# Patient Record
Sex: Male | Born: 1975 | Race: Black or African American | Hispanic: No
Health system: Southern US, Community
[De-identification: ages and names within clinical notes are randomized; demographics above are authoritative.]

---

## 2020-08-18 ENCOUNTER — Inpatient Hospital Stay (HOSPITAL_COMMUNITY): Payer: Self-pay

## 2020-08-18 ENCOUNTER — Emergency Department (HOSPITAL_COMMUNITY): Payer: Self-pay

## 2020-08-18 ENCOUNTER — Inpatient Hospital Stay (HOSPITAL_COMMUNITY)
Admission: EM | Admit: 2020-08-18 | Discharge: 2020-09-11 | DRG: 917 | Disposition: E | Payer: Self-pay | Attending: Internal Medicine | Admitting: Internal Medicine

## 2020-08-18 DIAGNOSIS — J9602 Acute respiratory failure with hypercapnia: Secondary | ICD-10-CM | POA: Diagnosis present

## 2020-08-18 DIAGNOSIS — G936 Cerebral edema: Secondary | ICD-10-CM | POA: Diagnosis present

## 2020-08-18 DIAGNOSIS — E1165 Type 2 diabetes mellitus with hyperglycemia: Secondary | ICD-10-CM | POA: Diagnosis present

## 2020-08-18 DIAGNOSIS — R579 Shock, unspecified: Secondary | ICD-10-CM

## 2020-08-18 DIAGNOSIS — E876 Hypokalemia: Secondary | ICD-10-CM | POA: Diagnosis present

## 2020-08-18 DIAGNOSIS — I469 Cardiac arrest, cause unspecified: Secondary | ICD-10-CM | POA: Diagnosis present

## 2020-08-18 DIAGNOSIS — F10129 Alcohol abuse with intoxication, unspecified: Secondary | ICD-10-CM | POA: Diagnosis present

## 2020-08-18 DIAGNOSIS — I472 Ventricular tachycardia, unspecified: Secondary | ICD-10-CM

## 2020-08-18 DIAGNOSIS — Z66 Do not resuscitate: Secondary | ICD-10-CM | POA: Diagnosis present

## 2020-08-18 DIAGNOSIS — R578 Other shock: Secondary | ICD-10-CM | POA: Diagnosis not present

## 2020-08-18 DIAGNOSIS — T50901A Poisoning by unspecified drugs, medicaments and biological substances, accidental (unintentional), initial encounter: Principal | ICD-10-CM | POA: Diagnosis present

## 2020-08-18 DIAGNOSIS — G9382 Brain death: Secondary | ICD-10-CM | POA: Diagnosis not present

## 2020-08-18 DIAGNOSIS — Z4659 Encounter for fitting and adjustment of other gastrointestinal appliance and device: Secondary | ICD-10-CM

## 2020-08-18 DIAGNOSIS — Z20822 Contact with and (suspected) exposure to covid-19: Secondary | ICD-10-CM | POA: Diagnosis present

## 2020-08-18 DIAGNOSIS — Y908 Blood alcohol level of 240 mg/100 ml or more: Secondary | ICD-10-CM | POA: Diagnosis present

## 2020-08-18 DIAGNOSIS — R4189 Other symptoms and signs involving cognitive functions and awareness: Secondary | ICD-10-CM

## 2020-08-18 DIAGNOSIS — G931 Anoxic brain damage, not elsewhere classified: Secondary | ICD-10-CM | POA: Diagnosis present

## 2020-08-18 DIAGNOSIS — G935 Compression of brain: Secondary | ICD-10-CM | POA: Diagnosis not present

## 2020-08-18 DIAGNOSIS — I468 Cardiac arrest due to other underlying condition: Secondary | ICD-10-CM | POA: Diagnosis present

## 2020-08-18 DIAGNOSIS — J9601 Acute respiratory failure with hypoxia: Secondary | ICD-10-CM | POA: Diagnosis present

## 2020-08-18 LAB — I-STAT ARTERIAL BLOOD GAS, ED
Acid-base deficit: 11 mmol/L — ABNORMAL HIGH (ref 0.0–2.0)
Bicarbonate: 18.4 mmol/L — ABNORMAL LOW (ref 20.0–28.0)
Calcium, Ion: 0.97 mmol/L — ABNORMAL LOW (ref 1.15–1.40)
HCT: 34 % — ABNORMAL LOW (ref 39.0–52.0)
Hemoglobin: 11.6 g/dL — ABNORMAL LOW (ref 13.0–17.0)
O2 Saturation: 100 %
Patient temperature: 98.6
Potassium: 2.2 mmol/L — CL (ref 3.5–5.1)
Sodium: 138 mmol/L (ref 135–145)
TCO2: 20 mmol/L — ABNORMAL LOW (ref 22–32)
pCO2 arterial: 55.6 mmHg — ABNORMAL HIGH (ref 32.0–48.0)
pH, Arterial: 7.128 — CL (ref 7.350–7.450)
pO2, Arterial: 314 mmHg — ABNORMAL HIGH (ref 83.0–108.0)

## 2020-08-18 LAB — I-STAT CHEM 8, ED
BUN: 4 mg/dL — ABNORMAL LOW (ref 8–23)
Calcium, Ion: 0.9 mmol/L — ABNORMAL LOW (ref 1.15–1.40)
Chloride: 98 mmol/L (ref 98–111)
Creatinine, Ser: 1.9 mg/dL — ABNORMAL HIGH (ref 0.61–1.24)
Glucose, Bld: 304 mg/dL — ABNORMAL HIGH (ref 70–99)
HCT: 34 % — ABNORMAL LOW (ref 39.0–52.0)
Hemoglobin: 11.6 g/dL — ABNORMAL LOW (ref 13.0–17.0)
Potassium: 2.3 mmol/L — CL (ref 3.5–5.1)
Sodium: 139 mmol/L (ref 135–145)
TCO2: 20 mmol/L — ABNORMAL LOW (ref 22–32)

## 2020-08-18 LAB — CBC WITH DIFFERENTIAL/PLATELET
Abs Immature Granulocytes: 0.29 10*3/uL — ABNORMAL HIGH (ref 0.00–0.07)
Basophils Absolute: 0 10*3/uL (ref 0.0–0.1)
Basophils Relative: 0 %
Eosinophils Absolute: 0 10*3/uL (ref 0.0–0.5)
Eosinophils Relative: 0 %
HCT: 35.5 % — ABNORMAL LOW (ref 39.0–52.0)
Hemoglobin: 11 g/dL — ABNORMAL LOW (ref 13.0–17.0)
Immature Granulocytes: 4 %
Lymphocytes Relative: 60 %
Lymphs Abs: 3.9 10*3/uL (ref 0.7–4.0)
MCH: 29.2 pg (ref 26.0–34.0)
MCHC: 31 g/dL (ref 30.0–36.0)
MCV: 94.2 fL (ref 80.0–100.0)
Monocytes Absolute: 0.3 10*3/uL (ref 0.1–1.0)
Monocytes Relative: 4 %
Neutro Abs: 2.1 10*3/uL (ref 1.7–7.7)
Neutrophils Relative %: 32 %
Platelets: 127 10*3/uL — ABNORMAL LOW (ref 150–400)
RBC: 3.77 MIL/uL — ABNORMAL LOW (ref 4.22–5.81)
RDW: 15.9 % — ABNORMAL HIGH (ref 11.5–15.5)
WBC: 6.7 10*3/uL (ref 4.0–10.5)
nRBC: 0.9 % — ABNORMAL HIGH (ref 0.0–0.2)

## 2020-08-18 LAB — TYPE AND SCREEN
ABO/RH(D): O POS
Antibody Screen: NEGATIVE

## 2020-08-18 LAB — URINALYSIS, ROUTINE W REFLEX MICROSCOPIC
Bilirubin Urine: NEGATIVE
Glucose, UA: NEGATIVE mg/dL
Ketones, ur: NEGATIVE mg/dL
Leukocytes,Ua: NEGATIVE
Nitrite: NEGATIVE
Protein, ur: NEGATIVE mg/dL
Specific Gravity, Urine: 1.003 — ABNORMAL LOW (ref 1.005–1.030)
pH: 6 (ref 5.0–8.0)

## 2020-08-18 LAB — RAPID URINE DRUG SCREEN, HOSP PERFORMED
Amphetamines: NOT DETECTED
Barbiturates: NOT DETECTED
Benzodiazepines: NOT DETECTED
Cocaine: NOT DETECTED
Opiates: NOT DETECTED
Tetrahydrocannabinol: NOT DETECTED

## 2020-08-18 LAB — GLUCOSE, CAPILLARY: Glucose-Capillary: 222 mg/dL — ABNORMAL HIGH (ref 70–99)

## 2020-08-18 LAB — MAGNESIUM: Magnesium: 3.2 mg/dL — ABNORMAL HIGH (ref 1.7–2.4)

## 2020-08-18 LAB — TROPONIN I (HIGH SENSITIVITY): Troponin I (High Sensitivity): 41 ng/L — ABNORMAL HIGH (ref <18)

## 2020-08-18 LAB — LACTIC ACID, PLASMA: Lactic Acid, Venous: >11 mmol/L (ref 0.5–1.9)

## 2020-08-18 LAB — PROTIME-INR
INR: 1.3 — ABNORMAL HIGH (ref 0.8–1.2)
Prothrombin Time: 15.3 seconds — ABNORMAL HIGH (ref 11.4–15.2)

## 2020-08-18 LAB — ACETAMINOPHEN LEVEL: Acetaminophen (Tylenol), Serum: <10 ug/mL — ABNORMAL LOW (ref 10–30)

## 2020-08-18 LAB — RESPIRATORY PANEL BY RT PCR (FLU A&B, COVID)
Influenza A by PCR: NEGATIVE
Influenza B by PCR: NEGATIVE
SARS Coronavirus 2 by RT PCR: NEGATIVE

## 2020-08-18 LAB — SALICYLATE LEVEL: Salicylate Lvl: <7 mg/dL — ABNORMAL LOW (ref 7.0–30.0)

## 2020-08-18 LAB — ETHANOL: Alcohol, Ethyl (B): 296 mg/dL — ABNORMAL HIGH (ref <10)

## 2020-08-18 MED ORDER — EPINEPHRINE 1 MG/10ML IJ SOSY
PREFILLED_SYRINGE | INTRAMUSCULAR | Status: AC | PRN
  Administered 2020-08-18 (×8): 1 mg via INTRAVENOUS

## 2020-08-18 MED ORDER — FENTANYL CITRATE (PF) 100 MCG/2ML IJ SOLN: 50.0000 ug | INTRAMUSCULAR | Status: DC | PRN

## 2020-08-18 MED ORDER — NOREPINEPHRINE BITARTRATE 1 MG/ML IV SOLN
INTRAVENOUS | Status: AC | PRN
  Administered 2020-08-18: 20 ug/kg/min via INTRAVENOUS

## 2020-08-18 MED ORDER — SODIUM CHLORIDE 0.9 % IV SOLN: INTRAVENOUS | Status: DC

## 2020-08-18 MED ORDER — IOHEXOL 350 MG/ML SOLN
100.0000 mL | Freq: Once | INTRAVENOUS | Status: AC | PRN
  Administered 2020-08-18: 100 mL via INTRAVENOUS

## 2020-08-18 MED ORDER — DOCUSATE SODIUM 50 MG/5ML PO LIQD
100.0000 mg | Freq: Two times a day (BID) | ORAL | Status: DC
  Administered 2020-08-20: 100 mg via ORAL
  Filled 2020-08-18 (×3): qty 10

## 2020-08-18 MED ORDER — SODIUM CHLORIDE 0.9 % IV SOLN: INTRAVENOUS | Status: DC | PRN

## 2020-08-18 MED ORDER — HEPARIN SODIUM (PORCINE) 5000 UNIT/ML IJ SOLN: 5000.0000 [IU] | Freq: Three times a day (TID) | INTRAMUSCULAR | Status: DC

## 2020-08-18 MED ORDER — MIDAZOLAM HCL 2 MG/2ML IJ SOLN: 2.0000 mg | INTRAMUSCULAR | Status: DC | PRN

## 2020-08-18 MED ORDER — INSULIN ASPART 100 UNIT/ML ~~LOC~~ SOLN
0.0000 [IU] | SUBCUTANEOUS | Status: DC
  Administered 2020-08-19 (×2): 8 [IU] via SUBCUTANEOUS
  Administered 2020-08-19: 5 [IU] via SUBCUTANEOUS
  Administered 2020-08-19: 2 [IU] via SUBCUTANEOUS
  Administered 2020-08-19: 3 [IU] via SUBCUTANEOUS
  Administered 2020-08-19: 2 [IU] via SUBCUTANEOUS
  Administered 2020-08-20 (×2): 3 [IU] via SUBCUTANEOUS
  Administered 2020-08-20: 5 [IU] via SUBCUTANEOUS
  Administered 2020-08-20: 8 [IU] via SUBCUTANEOUS
  Administered 2020-08-20 – 2020-08-21 (×2): 3 [IU] via SUBCUTANEOUS
  Administered 2020-08-21: 2 [IU] via SUBCUTANEOUS
  Administered 2020-08-21: 3 [IU] via SUBCUTANEOUS

## 2020-08-18 MED ORDER — POTASSIUM CHLORIDE 10 MEQ/100ML IV SOLN
10.0000 meq | INTRAVENOUS | Status: AC
  Administered 2020-08-18 – 2020-08-19 (×3): 10 meq via INTRAVENOUS
  Filled 2020-08-18 (×3): qty 100

## 2020-08-18 MED ORDER — POTASSIUM CITRATE-CITRIC ACID 1100-334 MG/5ML PO SOLN: 20.0000 meq | Freq: Once | ORAL | Status: DC

## 2020-08-18 MED ORDER — AMIODARONE HCL IN DEXTROSE 360-4.14 MG/200ML-% IV SOLN
60.0000 mg/h | INTRAVENOUS | Status: AC
  Administered 2020-08-18 – 2020-08-19 (×2): 60 mg/h via INTRAVENOUS

## 2020-08-18 MED ORDER — NOREPINEPHRINE 4 MG/250ML-% IV SOLN
0.0000 ug/min | INTRAVENOUS | Status: DC
  Administered 2020-08-19: 15 ug/min via INTRAVENOUS
  Administered 2020-08-19: 30 ug/min via INTRAVENOUS
  Filled 2020-08-18 (×2): qty 250

## 2020-08-18 MED ORDER — SODIUM CHLORIDE 0.9% FLUSH
10.0000 mL | Freq: Two times a day (BID) | INTRAVENOUS | Status: DC
  Administered 2020-08-18 – 2020-08-19 (×2): 10 mL
  Administered 2020-08-20: 20 mL
  Administered 2020-08-20 – 2020-08-21 (×2): 10 mL

## 2020-08-18 MED ORDER — CHLORHEXIDINE GLUCONATE 0.12% ORAL RINSE (MEDLINE KIT)
15.0000 mL | Freq: Two times a day (BID) | OROMUCOSAL | Status: DC
  Administered 2020-08-19 – 2020-08-21 (×6): 15 mL via OROMUCOSAL

## 2020-08-18 MED ORDER — VANCOMYCIN HCL 750 MG/150ML IV SOLN
750.0000 mg | Freq: Two times a day (BID) | INTRAVENOUS | Status: DC
  Administered 2020-08-19: 750 mg via INTRAVENOUS
  Filled 2020-08-18: qty 150

## 2020-08-18 MED ORDER — SODIUM BICARBONATE 8.4 % IV SOLN
INTRAVENOUS | Status: AC | PRN
  Administered 2020-08-18 (×2): 50 meq via INTRAVENOUS

## 2020-08-18 MED ORDER — HYDROCORTISONE NA SUCCINATE PF 100 MG IJ SOLR
50.0000 mg | Freq: Four times a day (QID) | INTRAMUSCULAR | Status: DC
  Administered 2020-08-19: 50 mg via INTRAVENOUS
  Filled 2020-08-18: qty 2

## 2020-08-18 MED ORDER — THIAMINE HCL 100 MG/ML IJ SOLN
100.0000 mg | Freq: Every day | INTRAMUSCULAR | Status: DC
  Administered 2020-08-19 – 2020-08-20 (×2): 100 mg via INTRAVENOUS
  Filled 2020-08-18 (×2): qty 2

## 2020-08-18 MED ORDER — AMIODARONE HCL IN DEXTROSE 360-4.14 MG/200ML-% IV SOLN
30.0000 mg/h | INTRAVENOUS | Status: DC
  Administered 2020-08-19 – 2020-08-20 (×2): 30 mg/h via INTRAVENOUS
  Filled 2020-08-18 (×5): qty 200

## 2020-08-18 MED ORDER — VANCOMYCIN HCL 2000 MG/400ML IV SOLN
2000.0000 mg | Freq: Once | INTRAVENOUS | Status: AC
  Administered 2020-08-19: 2000 mg via INTRAVENOUS
  Filled 2020-08-18: qty 400

## 2020-08-18 MED ORDER — POTASSIUM CHLORIDE 20 MEQ PO PACK
20.0000 meq | PACK | Freq: Once | ORAL | Status: AC
  Administered 2020-08-19: 20 meq
  Filled 2020-08-18: qty 1

## 2020-08-18 MED ORDER — DOCUSATE SODIUM 50 MG/5ML PO LIQD
100.0000 mg | Freq: Two times a day (BID) | ORAL | Status: DC | PRN
  Administered 2020-08-19 – 2020-08-20 (×2): 100 mg
  Filled 2020-08-18 (×2): qty 10

## 2020-08-18 MED ORDER — SODIUM CHLORIDE 0.9 % IV SOLN
INTRAVENOUS | Status: AC | PRN
  Administered 2020-08-18: 999 mL/h via INTRAVENOUS

## 2020-08-18 MED ORDER — POLYETHYLENE GLYCOL 3350 17 G PO PACK
17.0000 g | PACK | Freq: Every day | ORAL | Status: DC
  Administered 2020-08-19 – 2020-08-20 (×2): 17 g
  Filled 2020-08-18 (×2): qty 1

## 2020-08-18 MED ORDER — EPINEPHRINE HCL 5 MG/250ML IV SOLN IN NS
0.5000 ug/min | INTRAVENOUS | Status: DC
  Administered 2020-08-18: 10 ug/min via INTRAVENOUS
  Filled 2020-08-18: qty 250

## 2020-08-18 MED ORDER — SODIUM CHLORIDE 0.9 % IV SOLN
2.0000 g | Freq: Two times a day (BID) | INTRAVENOUS | Status: DC
  Administered 2020-08-19 (×2): 2 g via INTRAVENOUS
  Filled 2020-08-18 (×2): qty 2

## 2020-08-18 MED ORDER — DOPAMINE-DEXTROSE 3.2-5 MG/ML-% IV SOLN
INTRAVENOUS | Status: AC | PRN
  Administered 2020-08-18: 15 ug/kg/min via INTRAVENOUS

## 2020-08-18 MED ORDER — ORAL CARE MOUTH RINSE
15.0000 mL | OROMUCOSAL | Status: DC
  Administered 2020-08-19 – 2020-08-21 (×26): 15 mL via OROMUCOSAL

## 2020-08-18 MED ORDER — CHLORHEXIDINE GLUCONATE CLOTH 2 % EX PADS
6.0000 | MEDICATED_PAD | Freq: Every day | CUTANEOUS | Status: DC
  Administered 2020-08-19 – 2020-08-21 (×3): 6 via TOPICAL

## 2020-08-18 MED ORDER — VASOPRESSIN 20 UNITS/100 ML INFUSION FOR SHOCK: 0.0300 [IU]/min | INTRAVENOUS | Status: DC

## 2020-08-18 MED ORDER — MAGNESIUM SULFATE 2 GM/50ML IV SOLN
2.0000 g | Freq: Once | INTRAVENOUS | Status: AC
  Administered 2020-08-18: 2 g via INTRAVENOUS
  Filled 2020-08-18: qty 50

## 2020-08-18 MED ORDER — POLYETHYLENE GLYCOL 3350 17 G PO PACK
17.0000 g | PACK | Freq: Every day | ORAL | Status: DC | PRN
  Administered 2020-08-19: 17 g

## 2020-08-18 MED ORDER — AMIODARONE HCL 150 MG/3ML IV SOLN
INTRAVENOUS | Status: AC | PRN
  Administered 2020-08-18: 150 mg via INTRAVENOUS
  Administered 2020-08-18: 300 mg via INTRAVENOUS

## 2020-08-18 MED ORDER — SODIUM CHLORIDE 0.9% FLUSH: 10.0000 mL | INTRAVENOUS | Status: DC | PRN

## 2020-08-18 MED ORDER — FAMOTIDINE 40 MG/5ML PO SUSR
20.0000 mg | Freq: Two times a day (BID) | ORAL | Status: DC
  Administered 2020-08-19 – 2020-08-20 (×4): 20 mg
  Filled 2020-08-18 (×4): qty 2.5

## 2020-08-18 MED ORDER — PANTOPRAZOLE SODIUM 40 MG IV SOLR
40.0000 mg | Freq: Two times a day (BID) | INTRAVENOUS | Status: DC
  Administered 2020-08-19 – 2020-08-21 (×6): 40 mg via INTRAVENOUS
  Filled 2020-08-18 (×6): qty 40

## 2020-08-18 NOTE — Code Documentation (Signed)
Chest compressions continues. 

## 2020-08-18 NOTE — Code Documentation (Signed)
No palpable pulses , chest compressions restarted .

## 2020-08-18 NOTE — Code Documentation (Signed)
Pulse check - no pulse /V-tach , 3rd shocked delivered.

## 2020-08-18 NOTE — H&P (Addendum)
NAME:  Henry Lynn, MRN:  [id number], DOB:  11/11/1877, LOS: 0 ADMISSION DATE:  09/03/2020, CONSULTATION DATE:  09/08/2020 REFERRING MD:  Stevie Kern, CHIEF COMPLAINT:  Witnessed out of hospital cardiac arrest  Brief History   89yM unknown PMH who is s/p asystole to VT arrest with prolonged down time c/b early evidence significant anoxic brain injury  History of present illness   89yM unknown PMH who had witnessed out of hospital cardiac arrest. Witnesses called EMS, no CPR performed prior to EMS arrival with unknown duration of downtime prior to EMS arrival. Presenting rhythm asystole, 30 min ACLS in field, 20-30 min ACLS performed here. On presentation to ED here, initially in PEA arrest, later pulseless VT defibrillated x3, given amio boluses then infusion. 1L saline given during arrest, currently infusing second liter, couple amps of bicarb. He is on epi and levo drips at time of my evaluation. He remains unresponsive with fixed pupils.   Past Medical History  Unknown  Significant Hospital Events   S/p Asystole -> VT arrest with ROSC   Consults:  PCCM  Procedures:  10/8 R femoral arterial line  Significant Diagnostic Tests:  10/8 CTA chest no PE, small bilateral effusions with atelectasis 10/8 CTH with loss of gray white discrimination diffusely  Micro Data:  None available  Antimicrobials:  10/8 vanc/cefepime -   Interim history/subjective:  n/a  Objective   Blood pressure (!) 82/51, pulse 85, resp. rate (!) 24, height 5\' 10"  (1.778 m), SpO2 100 %.    Vent Mode: PRVC FiO2 (%):  [100 %] 100 % Set Rate:  [24 bmp] 24 bmp Vt Set:  [580 mL] 580 mL PEEP:  [5 cmH20] 5 cmH20 Plateau Pressure:  [20 cmH20] 20 cmH20  No intake or output data in the 24 hours ending 08/24/2020 2140 There were no vitals filed for this visit.  Examination: General: intubated, unresponsive HENT: NCAT, dry MM Lungs: mechanical breath sounds, normal work of breathing Cardiovascular: RRR, no  murmur, no JVD  Abdomen: soft, nontender, normal bowel sounds Extremities: warm, well-perfused without cyanosis, edema Neuro: does not follow commands, does not withdraw to pain, pupils fixed and dilated, no cough, no gag  Resolved Hospital Problem list   n/a  Assessment & Plan:   # Cardiac arrest: s/p PEA to VT arrest. Unclear etiology. Electrolytes are remarkably abnormal - wonder if this could have been contributory if he was susceptible to scd in setting of LVH. Some electrolyte abnormalities may be related to alcohol use given detectable etoh at admission, TCP. Although it is early in course, his pupillary exam and CTH findings would seem to portend a very poor prognosis regardless.  - ok to complete amiodarone 24h infusion given pulseless VT complicating arrest - fever avoidance - monitor for onset of clinical seizure activity, if not following commands in AM consider vEEG - formal TTE in the morning - shoot for SpO2 92-96% - avoid hypo/hypercapnia, keep 30 < pCO2 <45 - avoid sedation if possible, watch for neurologic recovery  # Shock: Doesn't appear overtly hypovolemic - IVC appears full with little respiratory variation on bedside 2141, EF appears mildly-moderately reduced 40% or so immediately post arrest.  - blood cx, UA - empiric ABX, narrow quickly as cultures allow or discontinue after 48h NG if no clear source - wean epi first, continue levo for MAP >65, vaso, stress dose steroids - formal tte in the morning - trend trop  # Bloody output in OGT:  - hold ppx ac for  now - protonix bid   # Lactic acidosis: - trend lactic  # Acute encephalopathy due to anoxic brain injury:  - as above avoid sedation if possible, watch for neurologic recovery  # Hypokalemia: - give potassium, recheck electrolytes  # Hypocalcemia: - check phos in setting of AKI vs CKD before replacement   Best practice:  Diet: npo Pain/Anxiety/Delirium protocol (if indicated): yes VAP protocol (if  indicated): yes DVT prophylaxis: yes GI prophylaxis: yes Glucose control: SSI Mobility:bed level Code Status: Full Family Communication: Unknown family Disposition: ICU  Labs   CBC: Recent Labs  Lab 08/25/2020 2036 09/04/2020 2102 08/14/2020 2103  WBC 6.7  --   --   NEUTROABS 2.1  --   --   HGB 11.0* 11.6* 11.6*  HCT 35.5* 34.0* 34.0*  MCV 94.2  --   --   PLT 127*  --   --     Basic Metabolic Panel: Recent Labs  Lab 08/11/2020 2102 09/02/2020 2103  NA 139 138  K 2.3* 2.2*  CL 98  --   GLUCOSE 304*  --   BUN 4*  --   CREATININE 1.90*  --    GFR: CrCl cannot be calculated (Unknown ideal weight.). Recent Labs  Lab 08/17/2020 2036  WBC 6.7    Liver Function Tests: No results for input(s): AST, ALT, ALKPHOS, BILITOT, PROT, ALBUMIN in the last 168 hours. No results for input(s): LIPASE, AMYLASE in the last 168 hours. No results for input(s): AMMONIA in the last 168 hours.  ABG    Component Value Date/Time   PHART 7.128 (LL) 08/27/2020 2103   PCO2ART 55.6 (H) 09/10/2020 2103   PO2ART 314 (H) 08/28/2020 2103   HCO3 18.4 (L) 09/08/2020 2103   TCO2 20 (L) 08/13/2020 2103   ACIDBASEDEF 11.0 (H) 09/06/2020 2103   O2SAT 100.0 08/11/2020 2103     Coagulation Profile: Recent Labs  Lab 09/09/2020 2036  INR 1.3*    Cardiac Enzymes: No results for input(s): CKTOTAL, CKMB, CKMBINDEX, TROPONINI in the last 168 hours.  HbA1C: No results found for: HGBA1C  CBG: No results for input(s): GLUCAP in the last 168 hours.  Review of Systems:   unable to obtain, intubated and not responsive at time of my evaluation  Past Medical History  He,  has no past medical history on file.   Surgical History   Unavailable   Social History     Unavailable Family History   His family history is not on file.   Allergies Not on File   Home Medications  Prior to Admission medications   Not on File     Critical care time: 30 minutes    This patient is critically ill with  multiple organ system failure; which, requires frequent high complexity decision making, assessment, support, evaluation, and titration of therapies. This was completed through the application of advanced monitoring technologies and extensive interpretation of multiple databases. During this encounter critical care time was devoted to patient care services described in this note for 30 minutes.  Vida Roller, Pulmonary/Critical Care

## 2020-08-18 NOTE — Code Documentation (Signed)
No pulse , chest compressions continues. 

## 2020-08-18 NOTE — ED Notes (Signed)
Patient arrived with EMS CPR in progress Samuel Bouche thumper) , [place] , CPR initiated by EMS received Epinephrine 1 mg 5 doses for Asystole prior to arrival , intraosseous at right tibia /Left AC 18g.Marland Kitchen

## 2020-08-18 NOTE — Progress Notes (Signed)
Transferred pt on the vent with RN x 2 to [facility] 09 without any complications.

## 2020-08-18 NOTE — Code Documentation (Signed)
Faint pulses present  

## 2020-08-18 NOTE — Code Documentation (Addendum)
Pulse check-  V- Tach - Shocked x1 , chest compressions continues.

## 2020-08-18 NOTE — Code Documentation (Signed)
PEA , chest compressions continues.

## 2020-08-18 NOTE — Progress Notes (Signed)
Pharmacy Antibiotic Note  Henry Lynn is a 44 y.o. male admitted on 08/27/2020 while CPR was in progress.  Patient had ROSC after 45-60 min of total CPR.  Pharmacy has been consulted for vancomycin and cefepime dosing as empiric therapy for sepsis.  Unknown age, weight and allergies.   Use estimated weight of 51kg and 44 year old for antibiotic dosing. SCr 1.9, nCrCL 50 ml/min, WBC WNL, LA > 11.  Plan: Vanc 2gm IV x 1, then 750mg  IV Q12H Cefepime 2g IV Q12H Monitor renal fxn and may need to dose vanc per level F/u updated weight, age  Height: 5\' 10"  (177.8 cm) IBW/kg (Calculated) : 73  No data recorded.  Recent Labs  Lab 08/26/2020 2036 08/17/2020 2102  WBC 6.7  --   CREATININE 1.74* 1.90*  LATICACIDVEN >11.0*  --     CrCl cannot be calculated (Unknown ideal weight.).    Not on File  Vanc 10/8 >> Cefepime 10/8 >>  10/8 covid / flu - negative 10/8 TA - 10/8 BCx -   Dezaria Methot D. 12/8, PharmD, BCPS, BCCCP 09/04/2020, 10:34 PM

## 2020-08-18 NOTE — Code Documentation (Signed)
Pulse check PEA , chest compressions continues.

## 2020-08-18 NOTE — Progress Notes (Signed)
Patient was taken to CT with RN x 2  and back to [facility] room without any complications.

## 2020-08-18 NOTE — Code Documentation (Signed)
Pulse check- present . Central line access set up for EDP .

## 2020-08-18 NOTE — ED Provider Notes (Addendum)
[facility] Provider Note   CSN: [id number] Arrival date & time: 08/19/2020  2005     History Chief Complaint  Patient presents with  . CPR in progress    [facility] is a 44 y.o. male.  Presents to ER as CPR in progress.  History limited due to acuity, unresponsive state.  Per EMS report Patient arrived with EMS CPR in progress [place] thumper) , witnessed collapsed [place] , CPR initiated by EMS received Epinephrine 1 mg 5 doses for Asystole prior to arrival , intraosseous at right tibia /Left AC 18g.. Intubated in field.  HPI     No past medical history on file.  Patient Active Problem List   Diagnosis Date Noted  . Cardiac arrest ([place]) 09/07/2020    No family history on file.  Social History   Tobacco Use  . Smoking status: Not on file  Substance Use Topics  . Alcohol use: Not on file  . Drug use: Not on file    Home Medications Prior to Admission medications   Not on File    Allergies    Patient has no allergy information on record.  Review of Systems   Review of Systems  Unable to perform ROS: Patient unresponsive    Physical Exam Updated Vital Signs BP 92/60   Pulse 84   Resp (!) 22   Ht 5' 10" (1.778 m)   SpO2 100%   Physical Exam Vitals and nursing note reviewed.  Constitutional:      Appearance: He is well-developed.     Comments: Unresponsive, CPR in progress  HENT:     Head: Normocephalic and atraumatic.     Nose: Nose normal.     Mouth/Throat:     Comments: ET tube in place Eyes:     Comments: Pupils fixed and dilated  Cardiovascular:     Comments: CPR in progress, no pulses on pulse check Pulmonary:     Comments: Bagged via endotracheal tube, equal breath sounds bilaterally with bagging Abdominal:     General: There is no distension.     Palpations: Abdomen is soft.  Genitourinary:    Penis: Normal.   Musculoskeletal:        General: No deformity or signs of injury.      Cervical back: Neck supple.  Skin:    General: Skin is dry.     Coloration: Skin is not pale.     Findings: No bruising or lesion.  Neurological:     Comments: GCS 3 T Pupils fixed and dilated No response to any stimuli     ED Results / Procedures / Treatments   Labs (all labs ordered are listed, but only abnormal results are displayed) Labs Reviewed  CBC WITH DIFFERENTIAL/PLATELET - Abnormal; Notable for the following components:      Result Value   RBC 3.77 (*)    Hemoglobin 11.0 (*)    HCT 35.5 (*)    RDW 15.9 (*)    Platelets 127 (*)    nRBC 0.9 (*)    Abs Immature Granulocytes 0.29 (*)    All other components within normal limits  COMPREHENSIVE METABOLIC PANEL - Abnormal; Notable for the following components:   Potassium 2.4 (*)    CO2 17 (*)    Glucose, Bld 314 (*)    BUN 6 (*)    Creatinine, Ser 1.74 (*)    Calcium 7.6 (*)    Total Protein 5.5 (*)  Albumin 2.8 (*)    AST 362 (*)    ALT 173 (*)    GFR, Estimated 23 (*)    Anion gap 25 (*)    All other components within normal limits  LACTIC ACID, PLASMA - Abnormal; Notable for the following components:   Lactic Acid, Venous >11.0 (*)    All other components within normal limits  MAGNESIUM - Abnormal; Notable for the following components:   Magnesium 3.2 (*)    All other components within normal limits  PROTIME-INR - Abnormal; Notable for the following components:   Prothrombin Time 15.3 (*)    INR 1.3 (*)    All other components within normal limits  URINALYSIS, ROUTINE W REFLEX MICROSCOPIC - Abnormal; Notable for the following components:   Color, Urine STRAW (*)    Specific Gravity, Urine 1.003 (*)    Hgb urine dipstick SMALL (*)    Bacteria, UA RARE (*)    All other components within normal limits  ETHANOL - Abnormal; Notable for the following components:   Alcohol, Ethyl (B) 296 (*)    All other components within normal limits  ACETAMINOPHEN LEVEL - Abnormal; Notable for the following components:    Acetaminophen (Tylenol), Serum <10 (*)    All other components within normal limits  SALICYLATE LEVEL - Abnormal; Notable for the following components:   Salicylate Lvl <3.2 (*)    All other components within normal limits  GLUCOSE, CAPILLARY - Abnormal; Notable for the following components:   Glucose-Capillary 222 (*)    All other components within normal limits  I-STAT CHEM 8, ED - Abnormal; Notable for the following components:   Potassium 2.3 (*)    BUN 4 (*)    Creatinine, Ser 1.90 (*)    Glucose, Bld 304 (*)    Calcium, Ion 0.90 (*)    TCO2 20 (*)    Hemoglobin 11.6 (*)    HCT 34.0 (*)    All other components within normal limits  I-STAT ARTERIAL BLOOD GAS, ED - Abnormal; Notable for the following components:   pH, Arterial 7.128 (*)    pCO2 arterial 55.6 (*)    pO2, Arterial 314 (*)    Bicarbonate 18.4 (*)    TCO2 20 (*)    Acid-base deficit 11.0 (*)    Potassium 2.2 (*)    Calcium, Ion 0.97 (*)    HCT 34.0 (*)    Hemoglobin 11.6 (*)    All other components within normal limits  TROPONIN I (HIGH SENSITIVITY) - Abnormal; Notable for the following components:   Troponin I (High Sensitivity) 41 (*)    All other components within normal limits  RESPIRATORY PANEL BY RT PCR (FLU A&B, COVID)  CULTURE, BLOOD (ROUTINE X 2)  CULTURE, BLOOD (ROUTINE X 2)  CULTURE, RESPIRATORY  MRSA PCR SCREENING  RAPID URINE DRUG SCREEN, HOSP PERFORMED  LACTIC ACID, PLASMA  CBC  CREATININE, SERUM  COMPREHENSIVE METABOLIC PANEL  MAGNESIUM  BRAIN NATRIURETIC PEPTIDE  CBC WITH DIFFERENTIAL/PLATELET  BLOOD GAS, ARTERIAL  PHOSPHORUS  HEMOGLOBIN A1C  AMMONIA  HIV ANTIBODY (ROUTINE TESTING W REFLEX)  CK  OSMOLALITY  BLOOD GAS, ARTERIAL  TYPE AND SCREEN  ABO/RH  TROPONIN I (HIGH SENSITIVITY)    EKG None 08/19/20 2042 Sinus rhythm Probable RVH w/ secondary repol abnormality Repolarization abnormality, prob rate related Prolonged QT interval  Radiology CT Head Wo  Contrast  Result Date: 09/10/2020 CLINICAL DATA:  Altered mental status. EXAM: CT HEAD WITHOUT CONTRAST TECHNIQUE: Contiguous axial images  were obtained from the base of the skull through the vertex without intravenous contrast. COMPARISON:  None. FINDINGS: Brain: There is diffuse loss of gray-white matter discrimination consistent with hypoxic-ischemic encephalopathy. Clinical correlation is recommended. There is no acute intracranial hemorrhage. No midline shift. No extra-axial fluid collection. Vascular: No hyperdense vessel or unexpected calcification. Skull: Normal. Negative for fracture or focal lesion. Sinuses/Orbits: There is diffuse mucoperiosteal thickening of paranasal sinuses and ethmoid air cells. No air-fluid level. The mastoid air cells are clear. There is opacification of the nasopharynx. Partially visualized enteric tube. Other: None IMPRESSION: Findings consistent with hypoxic-ischemic encephalopathy. Clinical correlation is recommended. No acute intracranial hemorrhage. These results were called by telephone at the time of interpretation on 09/08/2020 at 9:41 pm to provider [facility] , who verbally acknowledged these results. Electronically Signed   By: Anner Crete M.D.   On: 08/27/2020 21:50   CT Angio Chest PE W and/or Wo Contrast  Result Date: 08/23/2020 CLINICAL DATA:  PE suspected, high probability. EXAM: CT ANGIOGRAPHY CHEST WITH CONTRAST TECHNIQUE: Multidetector CT imaging of the chest was performed using the standard protocol during bolus administration of intravenous contrast. Multiplanar CT image reconstructions and MIPs were obtained to evaluate the vascular anatomy. CONTRAST:  128m OMNIPAQUE IOHEXOL 350 MG/ML SOLN COMPARISON:  None. FINDINGS: Cardiovascular: The heart size is normal. Aorta and great vessel origins are within normal limits. Pulmonary artery opacification is satisfactory. No focal filling defect scratched at no focal filling defects are present to  suggest pulmonary embolus. Mediastinum/Nodes: The patient is intubated. Enteric tube is in place. Significant mediastinal or hilar adenopathy is present. Lungs/Pleura: Small effusions are present bilaterally. Associated atelectasis is present. No other significant airspace disease or consolidation present. No pneumothorax is present. Upper Abdomen: Visualized upper abdomen is unremarkable. Musculoskeletal: Vertebral body heights and alignment are normal. No focal lytic or blastic lesions are present. Ribs are unremarkable. Review of the MIP images confirms the above findings. IMPRESSION: 1. No pulmonary embolus. 2. Small bilateral pleural effusions with associated atelectasis. Electronically Signed   By: CSan MorelleM.D.   On: 09/08/2020 21:37   DG Chest Portable 1 View  Result Date: 09/04/2020 CLINICAL DATA:  Check endotracheal tube placement EXAM: PORTABLE CHEST 1 VIEW COMPARISON:  None. FINDINGS: Endotracheal tube is noted in satisfactory position. Gastric catheter is noted within the stomach. Heart is mildly prominent but accentuated by the frontal technique. Poor inspiratory effort is noted with crowding of the vascular markings. There may be some mild vascular congestion. IMPRESSION: Endotracheal tube and gastric catheter in satisfactory position. Poor inspiratory effort with crowding of the vascular markings. Mild vascular congestion may be present. Electronically Signed   By: MInez CatalinaM.D.   On: 08/30/2020 21:42    Procedures CPR  Date/Time: 08/22/2020 11:39 PM Performed by: DLucrezia Starch MD Authorized by: DLucrezia Starch MD  CPR Procedure Details:    ACLS/BLS initiated by EMS: Yes     CPR/ACLS performed in the ED: Yes     Duration of CPR (minutes):  25   Outcome: ROSC obtained    CPR performed via ACLS guidelines under my direct supervision.  See RN documentation for details including defibrillator use, medications, doses and timing. Comments:     CPR in progress by  EMS -they estimated 30 minutes of compressions prior to arrival.  On arrival, PEA, followed ACLS protocol epinephrine, bicarb, later pulse check showed V. tach, defibrillated at 200J for total of 3 defibrillations, also gave amiodarone bolus 300 mg,  then later bolus of 150 mg.  ROSC obtained.  Pulses lost briefly 2 subsequent occasions, on both of these occasions, pulses obtained on first pulse check.  Estimate total ER CPR time 20-25 minutes. .Critical Care Performed by: Lucrezia Starch, MD Authorized by: Lucrezia Starch, MD   Critical care provider statement:    Critical care time (minutes):  95   Critical care was time spent personally by me on the following activities:  Discussions with consultants, evaluation of patient's response to treatment, examination of patient, ordering and performing treatments and interventions, ordering and review of laboratory studies, ordering and review of radiographic studies, pulse oximetry, re-evaluation of patient's condition, obtaining history from patient or surrogate and review of old charts .Central Line  Date/Time: 09/09/2020 11:44 PM Performed by: Lucrezia Starch, MD Authorized by: Lucrezia Starch, MD   Pre-procedure details:    Skin preparation:  ChloraPrep Anesthesia (see MAR for exact dosages):    Anesthesia method:  None Procedure details:    Location:  R femoral   Site selection rationale:  Placed during code, ease of access   Catheter size:  7 Fr   Landmarks identified: yes     Ultrasound guidance: yes     Sterile ultrasound techniques: Sterile gel and sterile probe covers were used     Number of attempts:  1   Successful placement: yes   Post-procedure details:    Post-procedure:  Dressing applied   Assessment:  Blood return through all ports and free fluid flow   Patient tolerance of procedure:  Tolerated well, no immediate complications Comments:     Central line placed immediately after ROSC obtained, site selected for  ease of access, need for emergent access, prepped with chloraprep, used sterlie technique, all reasonable attempts at sterility were performed.   (including critical care time)   (including critical care time)     Medications Ordered in ED Medications  amiodarone (NEXTERONE PREMIX) 360-4.14 MG/200ML-% (1.8 mg/mL) IV infusion (60 mg/hr Intravenous New Bag/Given 08/28/2020 2040)  amiodarone (NEXTERONE PREMIX) 360-4.14 MG/200ML-% (1.8 mg/mL) IV infusion (has no administration in time range)  norepinephrine (LEVOPHED) 12m in 2528mpremix infusion (25 mcg/min Intravenous Rate/Dose Change 08/16/2020 2229)  EPINEPHrine (ADRENALIN) 4 mg in NS 250 mL (0.016 mg/mL) premix infusion (5 mcg/min Intravenous Rate/Dose Change 08/20/2020 2230)  potassium chloride 10 mEq in 100 mL IVPB (10 mEq Intravenous New Bag/Given 09/10/2020 2200)  docusate (COLACE) 50 MG/5ML liquid 100 mg (has no administration in time range)  polyethylene glycol (MIRALAX / GLYCOLAX) packet 17 g (has no administration in time range)  famotidine (PEPCID) 40 MG/5ML suspension 20 mg (has no administration in time range)  docusate (COLACE) 50 MG/5ML liquid 100 mg (has no administration in time range)  polyethylene glycol (MIRALAX / GLYCOLAX) packet 17 g (has no administration in time range)  fentaNYL (SUBLIMAZE) injection 50 mcg (has no administration in time range)  fentaNYL (SUBLIMAZE) injection 50-200 mcg (has no administration in time range)  midazolam (VERSED) injection 2 mg (has no administration in time range)  midazolam (VERSED) injection 2 mg (has no administration in time range)  insulin aspart (novoLOG) injection 0-15 Units (has no administration in time range)  vancomycin (VANCOREADY) IVPB 2000 mg/400 mL (has no administration in time range)  vancomycin (VANCOREADY) IVPB 750 mg/150 mL (has no administration in time range)  ceFEPIme (MAXIPIME) 2 g in sodium chloride 0.9 % 100 mL IVPB (has no administration in time range)  potassium chloride  (KLOR-CON) packet 20  mEq (has no administration in time range)  vasopressin (PITRESSIN) 20 Units in sodium chloride 0.9 % 100 mL infusion-*FOR SHOCK* (has no administration in time range)  hydrocortisone sodium succinate (SOLU-CORTEF) 100 MG injection 50 mg (has no administration in time range)  sodium chloride flush (NS) 0.9 % injection 10-40 mL (has no administration in time range)  sodium chloride flush (NS) 0.9 % injection 10-40 mL (has no administration in time range)  Chlorhexidine Gluconate Cloth 2 % PADS 6 each (has no administration in time range)  chlorhexidine gluconate (MEDLINE KIT) (PERIDEX) 0.12 % solution 15 mL (has no administration in time range)  MEDLINE mouth rinse (has no administration in time range)  pantoprazole (PROTONIX) injection 40 mg (has no administration in time range)  thiamine (B-1) injection 100 mg (has no administration in time range)  0.9 %  sodium chloride infusion (has no administration in time range)  EPINEPHrine (ADRENALIN) 1 MG/10ML injection (1 mg Intravenous Given 08/24/2020 2051)  sodium bicarbonate injection (50 mEq Intravenous Given 08/28/2020 2017)  amiodarone (CORDARONE) injection (150 mg Intravenous Given 09/07/2020 2023)  norepinephrine (LEVOPHED) injection ( Intravenous Stopped 08/15/2020 2059)  0.9 %  sodium chloride infusion ( Intravenous New Bag/Given 08/17/2020 2140)  DOPamine (INTROPIN) 800 mg in dextrose 5 % 250 mL (3.2 mg/mL) infusion ( Intravenous Stopped 08/13/2020 2059)  magnesium sulfate IVPB 2 g 50 mL (0 g Intravenous Stopped 08/30/2020 2230)  iohexol (OMNIPAQUE) 350 MG/ML injection 100 mL (100 mLs Intravenous Contrast Given 08/28/2020 2130)    ED Course  I have reviewed the triage vital signs and the nursing notes.  Pertinent labs & imaging results that were available during my care of the patient were reviewed by me and considered in my medical decision making (see chart for details).  Clinical Course as of Aug 19 2351  Fri Aug 18, 2020  2202  Critical care at bedside to assume care   [RD]    Clinical Course User Index [RD] Lucrezia Starch, MD   MDM Rules/Calculators/A&P                         [facility] arrived CPR in progress.  EMS reported PEA, total coded time 30 minutes prior to arrival.  ET tube in place and functioning well.  Here, initial rhythm check was PEA.  Follow ACLS protocols.  Later pulse check patient found to be in pulseless ventricular tachycardia.  Provided defibrillation, amiodarone.  After receiving multiple doses of amiodarone and multiple defibrillation attempts, ROSC was achieved.  Placed right femoral central line for central access.  Patient had brief subsequent code events.  He was started on pressor support.  Reviewed case with the on-call cardiology given ROSC after ventricular tachycardia, he does not believe he is a candidate for any acute cardiac intervention.  Consulted critical care for admission.    Etiology for patient's arrest not clear at this time.  Unfortunately still did not have identity of gentleman.  CT head was negative for acute bleed but was concerning for anoxic brain injury.  CTA chest negative for pulmonary embolism.  Noted some electrolyte derangements, acidosis, but doubt these for etiology code.  Regardless of etiology, based on the CT head and physical exam, believe patient has likely suffered irrecoverable anoxic brain injury.  Dr. Verlee Monte with critical care came to bedside and assumed care.  Final Clinical Impression(s) / ED Diagnoses Final diagnoses:  Cardiac arrest ([place])  Hypokalemia  Ventricular tachycardia ([place])  Shock ([place])  Unresponsive episode    Rx / DC Orders ED Discharge Orders    None       Lucrezia Starch, MD 09/08/2020 5427    Lucrezia Starch, MD 08/19/20 [phone]

## 2020-08-18 NOTE — Code Documentation (Addendum)
2nd shocked delivered for Vtach.

## 2020-08-18 NOTE — Progress Notes (Signed)
eLink Physician-Brief Progress Note Patient Name: [facility] DOB: 11/11/1877 MRN: [id number]   Date of Service  08-Sep-2020  HPI/Events of Note  Patient admitted without of hospital cardiac arrest with prolonged resuscitation time of 45 minutes total, and post-resuscitation coma and acute respiratory failure requiring intubation. Toxic screen was negative for drugs but he had a blood ETOH level of 296.  eICU Interventions  New Patient Evaluation completed.        [name] September 08, 2020, 11:53 PM

## 2020-08-18 NOTE — Code Documentation (Signed)
Pulses present  

## 2020-08-19 ENCOUNTER — Inpatient Hospital Stay (HOSPITAL_COMMUNITY): Payer: Self-pay

## 2020-08-19 DIAGNOSIS — N179 Acute kidney failure, unspecified: Secondary | ICD-10-CM

## 2020-08-19 DIAGNOSIS — Z9911 Dependence on respirator [ventilator] status: Secondary | ICD-10-CM

## 2020-08-19 DIAGNOSIS — R739 Hyperglycemia, unspecified: Secondary | ICD-10-CM

## 2020-08-19 DIAGNOSIS — K7200 Acute and subacute hepatic failure without coma: Secondary | ICD-10-CM

## 2020-08-19 DIAGNOSIS — I469 Cardiac arrest, cause unspecified: Secondary | ICD-10-CM

## 2020-08-19 LAB — BASIC METABOLIC PANEL
Anion gap: 18 — ABNORMAL HIGH (ref 5–15)
BUN: 14 mg/dL (ref 6–20)
CO2: 17 mmol/L — ABNORMAL LOW (ref 22–32)
Calcium: 7.8 mg/dL — ABNORMAL LOW (ref 8.9–10.3)
Chloride: 103 mmol/L (ref 98–111)
Creatinine, Ser: 3.49 mg/dL — ABNORMAL HIGH (ref 0.61–1.24)
GFR, Estimated: 10 mL/min — ABNORMAL LOW (ref >60)
Glucose, Bld: 183 mg/dL — ABNORMAL HIGH (ref 70–99)
Potassium: 3.2 mmol/L — ABNORMAL LOW (ref 3.5–5.1)
Sodium: 138 mmol/L (ref 135–145)

## 2020-08-19 LAB — SODIUM: Sodium: 135 mmol/L (ref 135–145)

## 2020-08-19 LAB — CK: Total CK: 1484 U/L — ABNORMAL HIGH (ref 49–397)

## 2020-08-19 LAB — OSMOLALITY: Osmolality: 353 mOsm/kg (ref 275–295)

## 2020-08-19 LAB — POCT I-STAT, CHEM 8
BUN: 4 mg/dL — ABNORMAL LOW (ref 6–20)
Calcium, Ion: 0.9 mmol/L — ABNORMAL LOW (ref 1.15–1.40)
Chloride: 98 mmol/L (ref 98–111)
Creatinine, Ser: 1.9 mg/dL — ABNORMAL HIGH (ref 0.61–1.24)
Glucose, Bld: 304 mg/dL — ABNORMAL HIGH (ref 70–99)
HCT: 34 % — ABNORMAL LOW (ref 39.0–52.0)
Hemoglobin: 11.6 g/dL — ABNORMAL LOW (ref 13.0–17.0)
Potassium: 2.3 mmol/L — CL (ref 3.5–5.1)
Sodium: 139 mmol/L (ref 135–145)
TCO2: 20 mmol/L — ABNORMAL LOW (ref 22–32)

## 2020-08-19 LAB — CBC WITH DIFFERENTIAL/PLATELET
Abs Immature Granulocytes: 0.11 10*3/uL — ABNORMAL HIGH (ref 0.00–0.07)
Basophils Absolute: 0 10*3/uL (ref 0.0–0.1)
Basophils Relative: 0 %
Eosinophils Absolute: 0 10*3/uL (ref 0.0–0.5)
Eosinophils Relative: 0 %
HCT: 37.8 % — ABNORMAL LOW (ref 39.0–52.0)
Hemoglobin: 12 g/dL — ABNORMAL LOW (ref 13.0–17.0)
Immature Granulocytes: 1 %
Lymphocytes Relative: 15 %
Lymphs Abs: 1.2 10*3/uL (ref 0.7–4.0)
MCH: 29.2 pg (ref 26.0–34.0)
MCHC: 31.7 g/dL (ref 30.0–36.0)
MCV: 92 fL (ref 80.0–100.0)
Monocytes Absolute: 0.4 10*3/uL (ref 0.1–1.0)
Monocytes Relative: 5 %
Neutro Abs: 6.2 10*3/uL (ref 1.7–7.7)
Neutrophils Relative %: 79 %
Platelets: 198 10*3/uL (ref 150–400)
RBC: 4.11 MIL/uL — ABNORMAL LOW (ref 4.22–5.81)
RDW: 16.2 % — ABNORMAL HIGH (ref 11.5–15.5)
WBC: 7.9 10*3/uL (ref 4.0–10.5)
nRBC: 0.4 % — ABNORMAL HIGH (ref 0.0–0.2)

## 2020-08-19 LAB — POCT I-STAT 7, (LYTES, BLD GAS, ICA,H+H)
Acid-base deficit: 15 mmol/L — ABNORMAL HIGH (ref 0.0–2.0)
Acid-base deficit: 15 mmol/L — ABNORMAL HIGH (ref 0.0–2.0)
Acid-base deficit: 5 mmol/L — ABNORMAL HIGH (ref 0.0–2.0)
Bicarbonate: 11 mmol/L — ABNORMAL LOW (ref 20.0–28.0)
Bicarbonate: 11.8 mmol/L — ABNORMAL LOW (ref 20.0–28.0)
Bicarbonate: 17.3 mmol/L — ABNORMAL LOW (ref 20.0–28.0)
Calcium, Ion: 0.93 mmol/L — ABNORMAL LOW (ref 1.15–1.40)
Calcium, Ion: 1.01 mmol/L — ABNORMAL LOW (ref 1.15–1.40)
Calcium, Ion: 1.04 mmol/L — ABNORMAL LOW (ref 1.15–1.40)
HCT: 36 % — ABNORMAL LOW (ref 39.0–52.0)
HCT: 38 % — ABNORMAL LOW (ref 39.0–52.0)
HCT: 38 % — ABNORMAL LOW (ref 39.0–52.0)
Hemoglobin: 12.2 g/dL — ABNORMAL LOW (ref 13.0–17.0)
Hemoglobin: 12.9 g/dL — ABNORMAL LOW (ref 13.0–17.0)
Hemoglobin: 12.9 g/dL — ABNORMAL LOW (ref 13.0–17.0)
O2 Saturation: 100 %
O2 Saturation: 91 %
O2 Saturation: 98 %
Patient temperature: 32.5
Patient temperature: 35.9
Patient temperature: 36
Potassium: 3.3 mmol/L — ABNORMAL LOW (ref 3.5–5.1)
Potassium: 3.7 mmol/L (ref 3.5–5.1)
Potassium: 5.2 mmol/L — ABNORMAL HIGH (ref 3.5–5.1)
Sodium: 135 mmol/L (ref 135–145)
Sodium: 136 mmol/L (ref 135–145)
Sodium: 137 mmol/L (ref 135–145)
TCO2: 12 mmol/L — ABNORMAL LOW (ref 22–32)
TCO2: 13 mmol/L — ABNORMAL LOW (ref 22–32)
TCO2: 18 mmol/L — ABNORMAL LOW (ref 22–32)
pCO2 arterial: 23.4 mmHg — ABNORMAL LOW (ref 32.0–48.0)
pCO2 arterial: 24.2 mmHg — ABNORMAL LOW (ref 32.0–48.0)
pCO2 arterial: 24.2 mmHg — ABNORMAL LOW (ref 32.0–48.0)
pH, Arterial: 7.259 — ABNORMAL LOW (ref 7.350–7.450)
pH, Arterial: 7.273 — ABNORMAL LOW (ref 7.350–7.450)
pH, Arterial: 7.472 — ABNORMAL HIGH (ref 7.350–7.450)
pO2, Arterial: 123 mmHg — ABNORMAL HIGH (ref 83.0–108.0)
pO2, Arterial: 269 mmHg — ABNORMAL HIGH (ref 83.0–108.0)
pO2, Arterial: 52 mmHg — ABNORMAL LOW (ref 83.0–108.0)

## 2020-08-19 LAB — COMPREHENSIVE METABOLIC PANEL
ALT: 173 U/L — ABNORMAL HIGH (ref 0–44)
ALT: 331 U/L — ABNORMAL HIGH (ref 0–44)
AST: 1278 U/L — ABNORMAL HIGH (ref 15–41)
AST: 362 U/L — ABNORMAL HIGH (ref 15–41)
Albumin: 2.8 g/dL — ABNORMAL LOW (ref 3.5–5.0)
Albumin: 3.3 g/dL — ABNORMAL LOW (ref 3.5–5.0)
Alkaline Phosphatase: 112 U/L (ref 38–126)
Alkaline Phosphatase: 242 U/L — ABNORMAL HIGH (ref 38–126)
Anion gap: 25 — ABNORMAL HIGH (ref 5–15)
Anion gap: 29 — ABNORMAL HIGH (ref 5–15)
BUN: 6 mg/dL (ref 6–20)
BUN: 9 mg/dL (ref 6–20)
CO2: 11 mmol/L — ABNORMAL LOW (ref 22–32)
CO2: 17 mmol/L — ABNORMAL LOW (ref 22–32)
Calcium: 7.4 mg/dL — ABNORMAL LOW (ref 8.9–10.3)
Calcium: 7.6 mg/dL — ABNORMAL LOW (ref 8.9–10.3)
Chloride: 98 mmol/L (ref 98–111)
Chloride: 98 mmol/L (ref 98–111)
Creatinine, Ser: 1.74 mg/dL — ABNORMAL HIGH (ref 0.61–1.24)
Creatinine, Ser: 1.93 mg/dL — ABNORMAL HIGH (ref 0.61–1.24)
GFR, Estimated: 20 mL/min — ABNORMAL LOW (ref >60)
GFR, Estimated: 23 mL/min — ABNORMAL LOW (ref >60)
Glucose, Bld: 221 mg/dL — ABNORMAL HIGH (ref 70–99)
Glucose, Bld: 314 mg/dL — ABNORMAL HIGH (ref 70–99)
Potassium: 2.4 mmol/L — CL (ref 3.5–5.1)
Potassium: 3.4 mmol/L — ABNORMAL LOW (ref 3.5–5.1)
Sodium: 138 mmol/L (ref 135–145)
Sodium: 140 mmol/L (ref 135–145)
Total Bilirubin: 0.5 mg/dL (ref 0.3–1.2)
Total Bilirubin: 0.9 mg/dL (ref 0.3–1.2)
Total Protein: 5.5 g/dL — ABNORMAL LOW (ref 6.5–8.1)
Total Protein: 6.1 g/dL — ABNORMAL LOW (ref 6.5–8.1)

## 2020-08-19 LAB — CBC
HCT: 37.7 % — ABNORMAL LOW (ref 39.0–52.0)
Hemoglobin: 12.5 g/dL — ABNORMAL LOW (ref 13.0–17.0)
MCH: 29.3 pg (ref 26.0–34.0)
MCHC: 33.2 g/dL (ref 30.0–36.0)
MCV: 88.5 fL (ref 80.0–100.0)
Platelets: 177 10*3/uL (ref 150–400)
RBC: 4.26 MIL/uL (ref 4.22–5.81)
RDW: 16.4 % — ABNORMAL HIGH (ref 11.5–15.5)
WBC: 2.9 10*3/uL — ABNORMAL LOW (ref 4.0–10.5)
nRBC: 1.4 % — ABNORMAL HIGH (ref 0.0–0.2)

## 2020-08-19 LAB — AMMONIA: Ammonia: 109 umol/L — ABNORMAL HIGH (ref 9–35)

## 2020-08-19 LAB — ECHOCARDIOGRAM COMPLETE
Area-P 1/2: 5.46 cm2
Height: 70 in
S' Lateral: 4.4 cm
Weight: 3499.14 oz

## 2020-08-19 LAB — GLUCOSE, CAPILLARY
Glucose-Capillary: 181 mg/dL — ABNORMAL HIGH (ref 70–99)
Glucose-Capillary: 133 mg/dL — ABNORMAL HIGH (ref 70–99)
Glucose-Capillary: 147 mg/dL — ABNORMAL HIGH (ref 70–99)
Glucose-Capillary: 269 mg/dL — ABNORMAL HIGH (ref 70–99)
Glucose-Capillary: 279 mg/dL — ABNORMAL HIGH (ref 70–99)

## 2020-08-19 LAB — HIV ANTIBODY (ROUTINE TESTING W REFLEX): HIV Screen 4th Generation wRfx: NONREACTIVE

## 2020-08-19 LAB — HEMOGLOBIN A1C
Hgb A1c MFr Bld: 7.1 % — ABNORMAL HIGH (ref 4.8–5.6)
Mean Plasma Glucose: 157.07 mg/dL

## 2020-08-19 LAB — MRSA PCR SCREENING: MRSA by PCR: NEGATIVE

## 2020-08-19 LAB — MAGNESIUM: Magnesium: 2.9 mg/dL — ABNORMAL HIGH (ref 1.7–2.4)

## 2020-08-19 LAB — TROPONIN I (HIGH SENSITIVITY)
Troponin I (High Sensitivity): 3246 ng/L (ref <18)
Troponin I (High Sensitivity): 11056 ng/L (ref <18)

## 2020-08-19 LAB — ABO/RH: ABO/RH(D): O POS

## 2020-08-19 LAB — LACTIC ACID, PLASMA
Lactic Acid, Venous: >11 mmol/L (ref 0.5–1.9)
Lactic Acid, Venous: 1.5 mmol/L (ref 0.5–1.9)

## 2020-08-19 LAB — BRAIN NATRIURETIC PEPTIDE: B Natriuretic Peptide: 148.8 pg/mL — ABNORMAL HIGH (ref 0.0–100.0)

## 2020-08-19 LAB — PHOSPHORUS: Phosphorus: 9.9 mg/dL — ABNORMAL HIGH (ref 2.5–4.6)

## 2020-08-19 MED ORDER — SODIUM CHLORIDE 0.9 % IV SOLN
2.0000 g | INTRAVENOUS | Status: DC
  Administered 2020-08-20: 2 g via INTRAVENOUS
  Filled 2020-08-19: qty 2

## 2020-08-19 MED ORDER — POTASSIUM CHLORIDE 20 MEQ PO PACK
40.0000 meq | PACK | Freq: Once | ORAL | Status: AC
  Administered 2020-08-19: 40 meq via ORAL
  Filled 2020-08-19: qty 2

## 2020-08-19 MED ORDER — HEPARIN (PORCINE) 25000 UT/250ML-% IV SOLN
1200.0000 [IU]/h | INTRAVENOUS | Status: DC
  Administered 2020-08-19: 1200 [IU]/h via INTRAVENOUS
  Filled 2020-08-19: qty 250

## 2020-08-19 MED ORDER — ASPIRIN 325 MG PO TABS
325.0000 mg | ORAL_TABLET | Freq: Every day | ORAL | Status: DC
  Administered 2020-08-19 – 2020-08-20 (×2): 325 mg via ORAL
  Filled 2020-08-19 (×2): qty 1

## 2020-08-19 MED ORDER — NOREPINEPHRINE 16 MG/250ML-% IV SOLN
0.0000 ug/min | INTRAVENOUS | Status: DC
  Administered 2020-08-19: 2 ug/min via INTRAVENOUS
  Administered 2020-08-19: 45 ug/min via INTRAVENOUS
  Administered 2020-08-19: 46 ug/min via INTRAVENOUS
  Administered 2020-08-20: 55 ug/min via INTRAVENOUS
  Administered 2020-08-20 (×4): 60 ug/min via INTRAVENOUS
  Administered 2020-08-21: 36 ug/min via INTRAVENOUS
  Administered 2020-08-21: 60 ug/min via INTRAVENOUS
  Filled 2020-08-19 (×10): qty 250

## 2020-08-19 MED ORDER — MANNITOL 25 % IV SOLN: 50.0000 g | Freq: Every morning | Status: DC

## 2020-08-19 MED ORDER — SODIUM BICARBONATE 8.4 % IV SOLN
INTRAVENOUS | Status: DC
  Filled 2020-08-19 (×5): qty 850

## 2020-08-19 MED ORDER — MANNITOL 20 % IV SOLN
100.0000 g | Freq: Once | Status: AC
  Administered 2020-08-19: 100 g via INTRAVENOUS
  Filled 2020-08-19: qty 500

## 2020-08-19 MED ORDER — VASOPRESSIN 20 UNITS/100 ML INFUSION FOR SHOCK
0.0000 [IU]/min | INTRAVENOUS | Status: DC
  Administered 2020-08-19 – 2020-08-21 (×7): 0.04 [IU]/min via INTRAVENOUS
  Filled 2020-08-19 (×6): qty 100

## 2020-08-19 MED ORDER — SODIUM CHLORIDE 23.4 % INJECTION (4 MEQ/ML) FOR IV ADMINISTRATION
120.0000 meq | Freq: Once | INTRAVENOUS | Status: AC
  Administered 2020-08-19: 120 meq via INTRAVENOUS
  Filled 2020-08-19: qty 30

## 2020-08-19 MED ORDER — MANNITOL 20 % IV SOLN
50.0000 g | Freq: Every day | Status: DC
  Filled 2020-08-19: qty 250

## 2020-08-19 MED ORDER — EPINEPHRINE HCL 5 MG/250ML IV SOLN IN NS
0.5000 ug/min | INTRAVENOUS | Status: DC
  Administered 2020-08-19: 0.5 ug/min via INTRAVENOUS
  Administered 2020-08-19 – 2020-08-20 (×6): 20 ug/min via INTRAVENOUS
  Administered 2020-08-20: 21:00:00 19 ug/min via INTRAVENOUS
  Administered 2020-08-20 (×2): 20 ug/min via INTRAVENOUS
  Filled 2020-08-19 (×11): qty 250

## 2020-08-19 MED ORDER — POTASSIUM CHLORIDE 20 MEQ PO PACK
20.0000 meq | PACK | Freq: Once | ORAL | Status: AC
  Administered 2020-08-19: 20 meq via ORAL
  Filled 2020-08-19: qty 1

## 2020-08-19 MED ORDER — CALCIUM GLUCONATE-NACL 2-0.675 GM/100ML-% IV SOLN
2.0000 g | Freq: Once | INTRAVENOUS | Status: AC
  Administered 2020-08-19: 2000 mg via INTRAVENOUS
  Filled 2020-08-19: qty 100

## 2020-08-19 MED ORDER — IOHEXOL 300 MG/ML  SOLN: 100.0000 mL | Freq: Once | INTRAMUSCULAR | Status: DC | PRN

## 2020-08-19 MED ORDER — MANNITOL 25 % IV SOLN: 100.0000 g | Freq: Once | Status: DC

## 2020-08-19 MED FILL — Medication: Qty: 1 | Status: AC

## 2020-08-19 NOTE — Progress Notes (Signed)
Talked to security- [place] [employer] has been notified he needs to be fingerprinted for ID. They will contact them again to help explain the urgency.  Going up on pressors- now on 3.  STAT repeat BMP, lactic acid.  Steffanie Dunn, DO 08/19/20 12:13 PM [facility]

## 2020-08-19 NOTE — Progress Notes (Signed)
ANTICOAGULATION CONSULT NOTE - Initial Consult  Pharmacy Consult for Heparin  Indication: Elevated troponin, ?NSTEMI  Not on File  Patient Measurements: Height: 5\' 10"  (177.8 cm) Weight: 99.2 kg (218 lb 11.1 oz) IBW/kg (Calculated) : 73  Vital Signs: Temp: 96.8 F (36 C) (10/09 0600) Temp Source: Core (Comment) (10/09 0600) BP: 95/73 (10/09 0600) Pulse Rate: 86 (10/09 0600)  Labs: Recent Labs    08/29/2020 2036 08/30/2020 2036 08/17/2020 2102 09/02/2020 2103 08/19/20 0031 08/19/20 0031 08/19/20 0050 08/19/20 0400  HGB 11.0*   < > 11.6*   < > 12.2*   < > 12.0* 12.9*  HCT 35.5*   < > 34.0*   < > 36.0*  --  37.8* 38.0*  PLT 127*  --   --   --   --   --  198  --   LABPROT 15.3*  --   --   --   --   --   --   --   INR 1.3*  --   --   --   --   --   --   --   CREATININE 1.74*  --  1.90*  --   --   --  1.93*  --   CKTOTAL  --   --   --   --   --   --  1,484*  --   TROPONINIHS 41*  --   --   --   --   --  3,246*  --    < > = values in this interval not displayed.    Estimated Creatinine Clearance: -2.4 mL/min (A) (by C-G formula based on SCr of 1.93 mg/dL (H)).   Medical History: No past medical history on file.  Assessment: Unknown age male s/p cardiac arrest with ROSC, troponin elevated, starting heparin while undergoing work-up, Hgb 12.9, unknown past medical history. MD request low goal while ruling out GIB.   Goal of Therapy:  Heparin level 0.3-0.5 units/ml per MD Monitor platelets by anticoagulation protocol: Yes   Plan:  Start heparin drip at 1200 units/hr 1500 heparin level Daily CBC/HL Monitor for bleeding  10/19/20, PharmD, BCPS Clinical Pharmacist Phone: [phone]

## 2020-08-19 NOTE — Progress Notes (Addendum)
Stat  EEG complete - results pending.  

## 2020-08-19 NOTE — Progress Notes (Signed)
  Echocardiogram 2D Echocardiogram with 3D has been performed.  Leta Jungling M 08/19/2020, 8:46 AM

## 2020-08-19 NOTE — Plan of Care (Signed)
  Problem: Elimination: Goal: Will not experience complications related to urinary retention Outcome: Progressing   Problem: Clinical Measurements: Goal: Ability to maintain clinical measurements within normal limits will improve Outcome: Not Progressing   Problem: Skin Integrity: Goal: Risk for impaired skin integrity will be minimized. Outcome: Progressing   Problem: Health Behavior/Discharge Planning: Goal: Ability to manage health-related needs will improve Outcome: Not Progressing

## 2020-08-19 NOTE — Progress Notes (Signed)
Pharmacy Antibiotic Note  [facility] is a 44 y.o. male admitted on 08/25/2020 while CPR was in progress.  Patient had ROSC after 45-60 min of total CPR.  Pharmacy has been consulted for vancomycin and cefepime dosing as empiric therapy for sepsis.  Unknown age, weight and allergies.   Use estimated weight of 22kg and 44 year old for antibiotic dosing.  Scr continues to rise today.  Plan: Hold vancomycin for now, will check random level in the AM Reduce cefepime to 2g IV q 24 hrs. Monitor renal fxn and may need to dose vanc per level F/u updated weight, age  Height: 5\' 10"  (177.8 cm) Weight: 99.2 kg (218 lb 11.1 oz) IBW/kg (Calculated) : 73  Temp (24hrs), Avg:94.6 F (34.8 C), Min:90.3 F (32.4 C), Max:97.5 F (36.4 C)  Recent Labs  Lab 09/08/2020 2036 08/27/2020 2102 08/19/20 0050 08/19/20 1215  WBC 6.7  --  7.9 2.9*  CREATININE 1.74* 1.90* 1.93* 3.49*  LATICACIDVEN >11.0*  --  >11.0* 1.5    Estimated Creatinine Clearance: -1.3 mL/min (A) (by C-G formula based on SCr of 3.49 mg/dL (H)).    Not on File  Vanc 10/8 >> Cefepime 10/8 >>  10/8 covid / flu - negative 10/8 TA - 10/8 BCx -   Thuy D. 12-18-1984, PharmD, BCPS, BCCCP 08/19/2020, 2:25 PM

## 2020-08-19 NOTE — Progress Notes (Addendum)
eLink Physician-Brief Progress Note Patient Name: [facility] DOB: 11/11/1877 MRN: [id number]   Date of Service  08/19/2020  HPI/Events of Note  ABG reviewed.  eICU Interventions  Order to reduce FIO2 to  60 % entered. Calcium gluconate 2 gm iv x 1 ordered. ABG at 3 a.m.        [name] 08/19/2020, 1:46 AM

## 2020-08-19 NOTE — Procedures (Signed)
ient Name: Candi Leash   MRN: [id number]  Epilepsy Attending: Charlsie Quest  Referring Physician/Provider: Dr Karie Fetch Date: 08/19/2020 Duration:  29.14 mins  Patient history: unknown age male s/p cardiac arrest. EEG to evaluate for seizure.   Level of alertness: Comatose  AEDs during EEG study: None  Technical aspects: This EEG study was done with scalp electrodes positioned according to the 10-20 International system of electrode placement. Electrical activity was acquired at a sampling rate of 500Hz  and reviewed with a high frequency filter of 70Hz  and a low frequency filter of 1Hz . EEG data were recorded continuously and digitally stored.   Description:  EEG showed continuous generalized background suppression. EEG not reactive to noxiuos stimulation. Hyperventilation and photic stimulation were not performed.     ABNORMALITY -Background suppression, generalized   IMPRESSION: This study is suggestive of profound diffuse encephalopathy, nonspecific etiology but likely related to anoxic/hypoxic brain injury. No seizures or epileptiform discharges were seen throughout the recording.  Josue Falconi 

## 2020-08-19 NOTE — Progress Notes (Signed)
CDS called with Referral Number [id number] was given, bedside RN made aware

## 2020-08-19 NOTE — Progress Notes (Signed)
eLink Physician-Brief Progress Note Patient Name: [facility] DOB: [birth year] MRN: [id number]   Date of Service  08/19/2020  HPI/Events of Note  Patient with severe anoxic encephalopathy, he is about to herniate.  eICU Interventions  Hypertonic saline  23 % iv stat, Mannitol 1 gm / kg iv then 0.5  gm / kg iv Q 6 hours. Will discuss code status with family, neurology consult if not already seen.        Thomasene Lot Rylee Nuzum 08/19/2020, 10:14 PM

## 2020-08-19 NOTE — Procedures (Signed)
Arterial Catheter Insertion Procedure Note  Candi Leash  [id number]  11/11/1877  Date:08/19/20  Time:12:30 AM    Provider Performing: Milta Deiters    Procedure: Insertion of Arterial Line (56256) without US guidance  Indication(s) Blood pressure monitoring and/or need for frequent ABGs  Consent Unable to obtain consent due to emergent nature of procedure.  Anesthesia None   Time Out Verified patient identification, verified procedure, site/side was marked, verified correct patient position, special equipment/implants available, medications/allergies/relevant history reviewed, required imaging and test results available.   Sterile Technique Maximal sterile technique including full sterile barrier drape, hand hygiene, sterile gown, sterile gloves, mask, hair covering, sterile ultrasound probe cover (if used).   Procedure Description Area of catheter insertion was cleaned with chlorhexidine and draped in sterile fashion. Without real-time ultrasound guidance an arterial catheter was placed into the right radial artery.  Appropriate arterial tracings confirmed on monitor.     Complications/Tolerance None; patient tolerated the procedure well.   EBL Minimal   Specimen(s) None

## 2020-08-19 NOTE — Progress Notes (Addendum)
NAME:  Henry Lynn, MRN:  [id number], DOB:  11/11/1877, LOS: 1 ADMISSION DATE:  09/09/2020, CONSULTATION DATE:  09/04/2020 REFERRING MD:  Stevie Kern, CHIEF COMPLAINT:  Witnessed out of hospital cardiac arrest  Brief History   89yM unknown PMH who is s/p asystole to VT arrest with prolonged down time c/b early evidence significant anoxic brain injury  History of present illness   89yM unknown PMH who had witnessed out of hospital cardiac arrest. Witnesses called EMS, no CPR performed prior to EMS arrival with unknown duration of downtime prior to EMS arrival. Presenting rhythm asystole, 30 min ACLS in field, 20-30 min ACLS performed here. On presentation to ED here, initially in PEA arrest, later pulseless VT defibrillated x3, given amio boluses then infusion. 1L saline given during arrest, currently infusing second liter, couple amps of bicarb. He is on epi and levo drips at time of my evaluation. He remains unresponsive with fixed pupils.   Unknown identity- [place] PD notified to fingerprint.  Past Medical History  Unknown  Significant Hospital Events   S/p Asystole -> VT arrest with ROSC   Consults:  PCCM  Procedures:  10/8 R femoral arterial line  Significant Diagnostic Tests:  10/8 CTA chest no PE, small bilateral effusions with atelectasis 10/8 CTH with loss of gray white discrimination diffusely  Micro Data:  None available  Antimicrobials:  10/8 vanc/cefepime >  Interim history/subjective:  Increasing pressor requirements rapidly today  Objective   Blood pressure 91/67, pulse (!) 117, temperature (!) 97 F (36.1 C), temperature source Bladder, resp. rate (!) 25, height 5\' 10"  (1.778 m), weight 99.2 kg, SpO2 94 %.    Vent Mode: PRVC FiO2 (%):  [50 %-100 %] 50 % Set Rate:  [24 bmp-28 bmp] 25 bmp Vt Set:  [580 mL] 580 mL PEEP:  [5 cmH20-8 cmH20] 8 cmH20 Plateau Pressure:  [18 cmH20-27 cmH20] 27 cmH20   Intake/Output Summary (Last 24 hours) at 08/19/2020 1215 Last  data filed at 08/19/2020 1145 Gross per 24 hour  Intake 2217.35 ml  Output 2447 ml  Net -229.65 ml   Filed Weights   08/19/20 0437  Weight: 99.2 kg    Examination: General: critically ill appearing man laying in bed intubated, not sedated HENT: [place]/AT, eyes anicteric  Lungs: breathing synchronously with the vent, CTAB, bloody ETT secretions Cardiovascular: tachycardic, regular rhythm Abdomen: very distended, NT Extremities: warm, dry, no edema Neuro: Examined on no sedation. Fixed dilated pupils, no corneals or dolls eyes. Not breathing above the vent, no response to pain in any extremity or trapezius squeeze.   Resolved Hospital Problem list   n/a  Assessment & Plan:   Cardiac arrest: s/p PEA to VT arrest. Unclear etiology. Electrolytes are remarkably abnormal - wonder if this could have been contributory if he was susceptible to sudden cardiac death in setting of LVH. Also likely due to prolonged circulatory arres.  Some electrolyte abnormalities may be related to alcohol use given detectable etoh at admission, TCP. Although it is early in course, his pupillary exam and CTH findings would seem to portend a very poor prognosis regardless.  - ok to complete amiodarone 24h infusion given pulseless VT complicating arrest - aggressively treat fevers - EEG monitoring ordered - echo pending - neuroprotective measures - normoglycemia, normocapnia, normal electrolytes. Vent rate decreased for hypocapnia. - avoid all sedation for neuroprognostication -[place] PD asked to fingerprint to ID so we can contact family.  Shock: likely mixed distributive due to prolonged ischemia, possibly cardiac stunning.  Concerned for intra-abdominal ischemia with distention. -follow cultures & con't empiric broad spectrum antibiotics - adding back epinephrine infusion. Adding vasopressin. Continue NE for MAP >65, vaso, stress dose steroids. - serial trop  Acute respiratory failure with hypoxia and hypercapnia  requiring MV. No PE on CT scan, minimal likely aspiration pneumonitis vs pneumonia in b/l bases -LTVV, goal Pplat <30 and driving pressure <16- meeting goals -titrate PEEP & FiO2 per ARDS ladder -VAP prevention protocol -daily SAT & SBT when appropriate; mental status precludes at this point  Bloody output in OGT- concern for UGIB vs trauma from compressions - hold dvt prophylaxis for now - protonix bid  - repeat CBC pending  Lactic acidosis Profound metabolic acidosis- increasing bicarb on ABGs - trend lactic-pending -repeat BMP pending  Acute encephalopathy due to anoxic brain injury:  Acute alcohol intoxication - hold sedation - EEG  Hypokalemia Hyperphosphatemia Hypocalcemia - repleted   Diabetes with hyperglycemia, A1c 7.1 -SSI PRN -goal BG 140-180 while admitted to ICU  Shock liver Hyperammonemia -serial monitoring -lactulose when not so distended  Overall prognosis appears grim.  Best practice:  Diet: npo Pain/Anxiety/Delirium protocol (if indicated): yes VAP protocol (if indicated): yes DVT prophylaxis: yes GI prophylaxis: yes Glucose control: SSI Mobility:bed level Code Status: Full Family Communication: Unknown - hopeful that PD can help Disposition: ICU  Labs   CBC: Recent Labs  Lab 09/05/2020 2036 08/19/2020 2102 08/26/2020 2103 08/19/20 0031 08/19/20 0050 08/19/20 0400 08/19/20 0939  WBC 6.7  --   --   --  7.9  --   --   NEUTROABS 2.1  --   --   --  6.2  --   --   HGB 11.0*   < > 11.6* 12.2* 12.0* 12.9* 12.9*  HCT 35.5*   < > 34.0* 36.0* 37.8* 38.0* 38.0*  MCV 94.2  --   --   --  92.0  --   --   PLT 127*  --   --   --  198  --   --    < > = values in this interval not displayed.    Basic Metabolic Panel: Recent Labs  Lab 08/30/2020 2036 08/24/2020 2036 09/08/2020 2102 09/01/2020 2102 09/09/2020 2103 08/19/20 0031 08/19/20 0050 08/19/20 0400 08/19/20 0939  NA 140   < > 139   < > 138 135 138 136 137  K 2.4*   < > 2.3*   < > 2.2* 3.3* 3.4*  3.7 5.2*  CL 98  --  98  --   --   --  98  --   --   CO2 17*  --   --   --   --   --  11*  --   --   GLUCOSE 314*  --  304*  --   --   --  221*  --   --   BUN 6*  --  4*  --   --   --  9  --   --   CREATININE 1.74*  --  1.90*  --   --   --  1.93*  --   --   CALCIUM 7.6*  --   --   --   --   --  7.4*  --   --   MG 3.2*  --   --   --   --   --  2.9*  --   --   PHOS  --   --   --   --   --   --  9.9*  --   --    < > = values in this interval not displayed.   GFR: Estimated Creatinine Clearance: -2.4 mL/min (A) (by C-G formula based on SCr of 1.93 mg/dL (H)). Recent Labs  Lab 08/31/2020 2036 08/19/20 0050  WBC 6.7 7.9  LATICACIDVEN >11.0* >11.0*    Liver Function Tests: Recent Labs  Lab 08/20/2020 2036 08/19/20 0050  AST 362* 1,278*  ALT 173* 331*  ALKPHOS 112 242*  BILITOT 0.5 0.9  PROT 5.5* 6.1*  ALBUMIN 2.8* 3.3*   No results for input(s): LIPASE, AMYLASE in the last 168 hours. Recent Labs  Lab 08/19/20 0050  AMMONIA 109*    ABG    Component Value Date/Time   PHART 7.472 (H) 08/19/2020 0939   PCO2ART 23.4 (L) 08/19/2020 0939   PO2ART 52 (L) 08/19/2020 0939   HCO3 17.3 (L) 08/19/2020 0939   TCO2 18 (L) 08/19/2020 0939   ACIDBASEDEF 5.0 (H) 08/19/2020 0939   O2SAT 91.0 08/19/2020 0939     Coagulation Profile: Recent Labs  Lab 08/31/2020 2036  INR 1.3*    Cardiac Enzymes: Recent Labs  Lab 08/19/20 0050  CKTOTAL 1,484*    HbA1C: Hgb A1c MFr Bld  Date/Time Value Ref Range Status  08/19/2020 12:50 AM 7.1 (H) 4.8 - 5.6 % Final    Comment:    (NOTE) Pre diabetes:          5.7%-6.4%  Diabetes:              >6.4%  Glycemic control for   <7.0% adults with diabetes     CBG: Recent Labs  Lab 08/20/2020 2311 08/19/20 0412 08/19/20 0804 08/19/20 1122  GLUCAP 222* 181* 133* 147*     This patient is critically ill with multiple organ system failure which requires frequent high complexity decision making, assessment, support, evaluation, and titration  of therapies. This was completed through the application of advanced monitoring technologies and extensive interpretation of multiple databases. During this encounter critical care time was devoted to patient care services described in this note for 65 minutes.   Steffanie Dunn, DO 08/19/20 12:45 PM [facility]

## 2020-08-19 NOTE — Progress Notes (Signed)
RT note. Pt. Transported to CT and back to 2H without any complications.

## 2020-08-19 NOTE — Plan of Care (Addendum)
PCCM Plan of Care Note  # Troponinemia: Initial EKG with elevation in aVR and ST depressions however this was in setting of remarkable hypokalemia and was immediately post arrest - after correction of hypokalemia ST changes have largely resolved. Considerations include NSTEMI which may be demand related in setting catecholamine requirement, myocardial injury from arrest, developing stress cardiomyopathy. Either way in setting of likely poor neurologic prognosis feel he is unlikely candidate for invasive workup. - full dose ASA, high bleeding risk heparin gtt - TTE in AM - trend trop  Nate Annye Rusk, Pulmonary/Critical Care

## 2020-08-19 NOTE — Plan of Care (Signed)
Phone Conversation I was called by E link PCCM attending Dr. Warrick Parisian to review the patient's head CT.  Out-of-hospital post cardiac arrest with absent brainstem reflexes per the day critical care attending.  Fixed dilated pupils, no corneals or doll's eyes.  Breathing with the vent.  No spontaneous movement or movement to noxious stimulation. CT head done an hour ago reveals extensive diffuse cerebral edema with the pseudosubarachnoid pattern which is usually suggestive of extremely increased intracranial pressure.  There is effacement of basal cisterns and ventricles with the findings being consistent with global anoxic injury. There is downward herniation of the brainstem with cerebellar tonsils extending more than a centimeter below the foramen magnum. I recommended that they perform a brain death exam and apnea test at this time to confirm brain death. Please call neurology if we can be of assistance.  -- Milon Dikes, MD [facility]

## 2020-08-20 DIAGNOSIS — J9601 Acute respiratory failure with hypoxia: Secondary | ICD-10-CM

## 2020-08-20 LAB — VANCOMYCIN, RANDOM
Vancomycin Rm: 38
Vancomycin Rm: 30

## 2020-08-20 LAB — COMPREHENSIVE METABOLIC PANEL
ALT: 213 U/L — ABNORMAL HIGH (ref 0–44)
AST: 575 U/L — ABNORMAL HIGH (ref 15–41)
Albumin: 2.2 g/dL — ABNORMAL LOW (ref 3.5–5.0)
Alkaline Phosphatase: 114 U/L (ref 38–126)
Anion gap: 18 — ABNORMAL HIGH (ref 5–15)
BUN: 23 mg/dL — ABNORMAL HIGH (ref 6–20)
CO2: 16 mmol/L — ABNORMAL LOW (ref 22–32)
Calcium: 6.6 mg/dL — ABNORMAL LOW (ref 8.9–10.3)
Chloride: 97 mmol/L — ABNORMAL LOW (ref 98–111)
Creatinine, Ser: 4.97 mg/dL — ABNORMAL HIGH (ref 0.61–1.24)
GFR, Estimated: 13 mL/min — ABNORMAL LOW (ref >60)
Glucose, Bld: 294 mg/dL — ABNORMAL HIGH (ref 70–99)
Potassium: 3.1 mmol/L — ABNORMAL LOW (ref 3.5–5.1)
Sodium: 131 mmol/L — ABNORMAL LOW (ref 135–145)
Total Bilirubin: 0.6 mg/dL (ref 0.3–1.2)
Total Protein: 4.5 g/dL — ABNORMAL LOW (ref 6.5–8.1)

## 2020-08-20 LAB — GLUCOSE, CAPILLARY
Glucose-Capillary: 120 mg/dL — ABNORMAL HIGH (ref 70–99)
Glucose-Capillary: 169 mg/dL — ABNORMAL HIGH (ref 70–99)
Glucose-Capillary: 170 mg/dL — ABNORMAL HIGH (ref 70–99)
Glucose-Capillary: 198 mg/dL — ABNORMAL HIGH (ref 70–99)
Glucose-Capillary: 219 mg/dL — ABNORMAL HIGH (ref 70–99)
Glucose-Capillary: 297 mg/dL — ABNORMAL HIGH (ref 70–99)

## 2020-08-20 LAB — POCT I-STAT 7, (LYTES, BLD GAS, ICA,H+H)
Acid-base deficit: 8 mmol/L — ABNORMAL HIGH (ref 0.0–2.0)
Bicarbonate: 17.8 mmol/L — ABNORMAL LOW (ref 20.0–28.0)
Calcium, Ion: 0.93 mmol/L — ABNORMAL LOW (ref 1.15–1.40)
HCT: 32 % — ABNORMAL LOW (ref 39.0–52.0)
Hemoglobin: 10.9 g/dL — ABNORMAL LOW (ref 13.0–17.0)
O2 Saturation: 96 %
Patient temperature: 36.7
Potassium: 3.5 mmol/L (ref 3.5–5.1)
Sodium: 134 mmol/L — ABNORMAL LOW (ref 135–145)
TCO2: 19 mmol/L — ABNORMAL LOW (ref 22–32)
pCO2 arterial: 35.2 mmHg (ref 32.0–48.0)
pH, Arterial: 7.311 — ABNORMAL LOW (ref 7.350–7.450)
pO2, Arterial: 90 mmHg (ref 83.0–108.0)

## 2020-08-20 LAB — CBC
HCT: 33.6 % — ABNORMAL LOW (ref 39.0–52.0)
Hemoglobin: 11.1 g/dL — ABNORMAL LOW (ref 13.0–17.0)
MCH: 29.4 pg (ref 26.0–34.0)
MCHC: 33 g/dL (ref 30.0–36.0)
MCV: 89.1 fL (ref 80.0–100.0)
Platelets: 170 10*3/uL (ref 150–400)
RBC: 3.77 MIL/uL — ABNORMAL LOW (ref 4.22–5.81)
RDW: 17.1 % — ABNORMAL HIGH (ref 11.5–15.5)
WBC: 3.8 10*3/uL — ABNORMAL LOW (ref 4.0–10.5)
nRBC: 0.8 % — ABNORMAL HIGH (ref 0.0–0.2)

## 2020-08-20 LAB — OSMOLALITY
Osmolality: 310 mOsm/kg — ABNORMAL HIGH (ref 275–295)
Osmolality: 309 mOsm/kg — ABNORMAL HIGH (ref 275–295)
Osmolality: 314 mOsm/kg — ABNORMAL HIGH (ref 275–295)
Osmolality: 315 mOsm/kg — ABNORMAL HIGH (ref 275–295)

## 2020-08-20 LAB — SODIUM
Sodium: 132 mmol/L — ABNORMAL LOW (ref 135–145)
Sodium: 133 mmol/L — ABNORMAL LOW (ref 135–145)

## 2020-08-20 MED ORDER — FAMOTIDINE 40 MG/5ML PO SUSR: 20.0000 mg | Freq: Every day | ORAL | Status: DC

## 2020-08-20 MED ORDER — HEPARIN SODIUM (PORCINE) 5000 UNIT/ML IJ SOLN
5000.0000 [IU] | Freq: Three times a day (TID) | INTRAMUSCULAR | Status: DC
  Administered 2020-08-20 – 2020-08-21 (×3): 5000 [IU] via SUBCUTANEOUS
  Filled 2020-08-20 (×3): qty 1

## 2020-08-20 MED ORDER — PHENYLEPHRINE HCL-NACL 10-0.9 MG/250ML-% IV SOLN
0.0000 ug/min | INTRAVENOUS | Status: DC
  Administered 2020-08-20: 20 ug/min via INTRAVENOUS
  Filled 2020-08-20 (×2): qty 500

## 2020-08-20 MED ORDER — PHENYLEPHRINE CONCENTRATED 100MG/250ML (0.4 MG/ML) INFUSION SIMPLE
0.0000 ug/min | INTRAVENOUS | Status: DC
  Administered 2020-08-20: 20 ug/min via INTRAVENOUS
  Administered 2020-08-21: 110 ug/min via INTRAVENOUS
  Filled 2020-08-20 (×5): qty 250

## 2020-08-20 MED ORDER — MANNITOL 20 % IV SOLN
50.0000 g | Freq: Every day | Status: DC
  Administered 2020-08-20: 50 g via INTRAVENOUS
  Filled 2020-08-20 (×2): qty 250

## 2020-08-20 MED ORDER — POTASSIUM CHLORIDE 10 MEQ/50ML IV SOLN
10.0000 meq | INTRAVENOUS | Status: AC
  Administered 2020-08-20 (×2): 10 meq via INTRAVENOUS
  Filled 2020-08-20 (×2): qty 50

## 2020-08-20 MED ORDER — INSULIN DETEMIR 100 UNIT/ML ~~LOC~~ SOLN
15.0000 [IU] | Freq: Every day | SUBCUTANEOUS | Status: DC
  Administered 2020-08-20: 15 [IU] via SUBCUTANEOUS
  Filled 2020-08-20 (×2): qty 0.15

## 2020-08-20 NOTE — Plan of Care (Signed)
Spoke to brother Normand Sloop [phone]) about his brother's care and my concern over his poor prognosis. He will call other family members and discuss goals of care with them, but he agrees that additional resuscitation in the event of a cardiac arrest would be futile. Code status changed to DNR. RN Maralyn Sago present during discussion. Continuing all other care measures for now.  Steffanie Dunn, DO 08/20/20 12:38 PM [facility]

## 2020-08-20 NOTE — TOC Progression Note (Addendum)
Transition of Care [facility]) - Progression Note    Patient Details  Name: Henry Lynn MRN: [id number] Date of Birth: 01-04-1976  Transition [facility]) CM/SW Contact  Lockie Pares, RN Phone Number: 08/20/2020, 11:22 AM  Clinical Narrative:    Asked by staff to assist in finding family Patient apparently incarcerated in [place] in 2014. . 3  potential family members through computer search:  Garey Alleva [phone] called no answer   Shawnmichael Parenteau 44 years old [phone] no answer. No message could be left on either phone.   TANNER YELEY [phone] rang without a message [phone] I left a message to call back asap.   These are not confirmed family members as of yet. Patient currently is on multiple pressors and is declining post cardiac arrest. Will continue to search for leads with family.         Expected Discharge Plan and Services                                                 Social Determinants of Health (SDOH) Interventions    Readmission Risk Interventions No flowsheet data found.

## 2020-08-20 NOTE — Progress Notes (Signed)
Pharmacy Antibiotic Note  Henry Lynn is a 44 y.o. male admitted on 08/13/2020 while CPR was in progress.  Patient had ROSC after 45-60 min of total CPR.  Pharmacy has been consulted for vancomycin and cefepime dosing as empiric therapy for sepsis.  Scr continues to rise today.  Random vancomycin level checked this AM = 38.  No subsequent doses given today.  Plan: Continue to hold vancomycin for now, will check random level in the AM Continue cefepime to 2g IV q 24 hrs. Monitor renal fxn and may need to dose vanc per level.  Height: 5\' 10"  (177.8 cm) Weight: 102.8 kg (226 lb 10.1 oz) IBW/kg (Calculated) : 73  Temp (24hrs), Avg:97.3 F (36.3 C), Min:95.2 F (35.1 C), Max:98.8 F (37.1 C)  Recent Labs  Lab 21-Aug-2020 2036 08/28/2020 2102 08/19/20 0050 08/19/20 1215 08/20/20 0132 08/20/20 0625  WBC 6.7  --  7.9 2.9* 3.8*  --   CREATININE 1.74* 1.90*  1.90* 1.93* 3.49* 4.97*  --   LATICACIDVEN >11.0*  --  >11.0* 1.5  --   --   VANCORANDOM  --   --   --   --   --  38    Estimated Creatinine Clearance: 22.5 mL/min (A) (by C-G formula based on SCr of 4.97 mg/dL (H)).    Not on File Antimicrobials this admission:  Vanc 10/8 >> Cefepime 10/8 >>  Dose adjustments this admission:  10/10 VR = 38  Microbiology results:  10/8 covid / flu - negative 10/8 TA - 10/8 BCx -  10/9 TA > pending 10/9 BCx x 2 >   12/9, Reece Leader, BCPS, [facility] Clinical Pharmacist  08/20/2020 2:06 PM   [facility] pharmacy phone numbers are listed on [email or web address]

## 2020-08-20 NOTE — Progress Notes (Signed)
NAMEIssachar Broady, MRN:  [id number], DOB:  1976-06-05, LOS: 2 ADMISSION DATE:  09/10/2020, CONSULTATION DATE:  2020/09/10 REFERRING MD:  Stevie Kern, CHIEF COMPLAINT:  Witnessed out of hospital cardiac arrest  Brief History   89yM unknown PMH who is s/p asystole to VT arrest with prolonged down time c/b early evidence significant anoxic brain injury  History of present illness   44 y/o M who came in as [name] with unknown PMH who had witnessed out of hospital cardiac arrest. Witnesses called EMS, no CPR performed prior to EMS arrival with unknown duration of downtime prior to EMS arrival. Presenting rhythm asystole, 30 min ACLS in field, 20-30 min ACLS performed here. On presentation to ED here, initially in PEA arrest, later pulseless VT defibrillated x3, given amio boluses then infusion. 1L saline given during arrest, currently infusing second liter, couple amps of bicarb. He is on epi and levo drips at time of my evaluation. He remains unresponsive with fixed pupils.   [place] PD notified to fingerprint.  Past Medical History  Unknown  Significant Hospital Events   S/p Asystole -> VT arrest with ROSC   Consults:  PCCM  Procedures:  10/8 R femoral arterial line  Significant Diagnostic Tests:  10/8 CTA chest no PE, small bilateral effusions with atelectasis 10/8 CTH with loss of gray white discrimination diffusely Echo: LVEF 50-55%, G2DD,  EEG ABNORMALITY -Background suppression, generalized   IMPRESSION: This study is suggestive of profound diffuse encephalopathy, nonspecific etiology but likely related to anoxic/hypoxic brain injury. No seizures or epileptiform discharges were seen throughout the recording.   Micro Data:  Trach aspirate > mod GPC, few GNcoccobacilli   Antimicrobials:  10/8 vanc/cefepime >  Interim history/subjective:  Herniating, started on hypertonic saline and mannitol overnight. Neurology recommended brain death testing.  Objective   Blood pressure  93/62, pulse (!) 105, temperature 98.2 F (36.8 C), temperature source Bladder, resp. rate (!) 22, height 5\' 10"  (1.778 m), weight 102.8 kg, SpO2 95 %.    Vent Mode: PRVC FiO2 (%):  [50 %] 50 % Set Rate:  [25 bmp-28 bmp] 25 bmp Vt Set:  [580 mL] 580 mL PEEP:  [8 cmH20] 8 cmH20 Plateau Pressure:  [24 cmH20-27 cmH20] 24 cmH20   Intake/Output Summary (Last 24 hours) at 08/20/2020 10/20/2020 Last data filed at 08/20/2020 0800 Gross per 24 hour  Intake 5593.18 ml  Output 1365 ml  Net 4228.18 ml   Filed Weights   08/19/20 0437 08/20/20 0438  Weight: 99.2 kg 102.8 kg    Examination: General: critically ill appearing man laying in bed in intubated, not sedated HENT: [place]/AT, eyes anicteric Lungs: breathing synchronously on the vent, no significant tracheal secretions. CTAB. Cardiovascular: tachycardic, regular rhythm Abdomen: distended, soft, NT Extremities: warm, femoral A-line with good distal perfusion Neuro: examined on no sedation, on mannitol. No pupillary, corneal, or dolls eyes reflex. Breaths over the vent when turned down to 8 from a rate of 22. No withdrawal from pain in any extremity, no response to trapezius squeeze. No tracheal cough or pharyngeal gag.  Resolved Hospital Problem list   n/a  Assessment & Plan:   Cardiac arrest: s/p PEA to VT arrest. Concern for drug overdose. Intoxicated upon arrival. -send- out fentanyl level pending - d/c amiodarone; has not been having ectopy to suggest high risk for recurrence - treat fevers with cooling blanket - neuroprotective measures - normoglycemia, normocapnia, normal electrolytes. Vent rate decreased for hypocapnia. - avoid all sedation for neuroprognostication -family contacted today  with the help of case management  Shock: likely mixed distributive due to prolonged ischemia, less likely cardiac stunning.  -follow cultures & con't empiric broad spectrum antibiotics -adding neosynephrine; con't NE, vaso, epi gtt  Acute  respiratory failure with hypoxia and hypercapnia requiring MV. No PE on CT scan, minimal likely aspiration pneumonitis vs pneumonia in b/l bases -LTVV, goal Pplat <30 and driving pressure <16<15- meeting goals -titrate PEEP & FiO2 per ARDS ladder -VAP prevention protocol -daily SAT & SBT when appropriate; mental status currently precludes extubation  Bloody output in OGT- concern for UGIB vs trauma from compressions - ok to start [place] heparin - con't protonix bid  - repeat CBC pending  Lactic acidosis AGMA -con't bicarb gtt  Acute encephalopathy due to anoxic brain injury Acute alcohol intoxication -con't to avoid all sedation -con't hyperosmolar therapy started overnight, although I do not feel that this will change his eventual outcome given the severity of his anoxic injury   Hypokalemia Hyperphosphatemia Hypocalcemia - repleted   Diabetes with hyperglycemia, A1c 7.1 -SSI PRN -goal BG 140-180 while admitted to ICU  Shock liver Hyperammonemia -con't to monitor  GoC Overall prognosis remains very poor.  Discussed this with multiple family members who understand the gravity of his situation. Reviewed his 2 brain CT scans with his sister showing her his brainstem herniation and the progressive edema. He is DNR and family understands that once out-of-town family has been able to see him, it would be best for him to not prolong his care as it will not change the eventual outcome.  Best practice:  Diet: npo Pain/Anxiety/Delirium protocol (if indicated): yes VAP protocol (if indicated): yes DVT prophylaxis: yes GI prophylaxis: yes Glucose control: SSI Mobility:bed level Code Status: Full Family Communication:  Disposition: ICU  Labs   CBC: Recent Labs  Lab 09/09/2020 2036 09/06/2020 2102 08/19/20 0050 08/19/20 0050 08/19/20 0400 08/19/20 0939 08/19/20 1215 08/20/20 0132 08/20/20 0755  WBC 6.7  --  7.9  --   --   --  2.9* 3.8*  --   NEUTROABS 2.1  --  6.2  --   --   --    --   --   --   HGB 11.0*   < > 12.0*   < > 12.9* 12.9* 12.5* 11.1* 10.9*  HCT 35.5*   < > 37.8*   < > 38.0* 38.0* 37.7* 33.6* 32.0*  MCV 94.2  --  92.0  --   --   --  88.5 89.1  --   PLT 127*  --  198  --   --   --  177 170  --    < > = values in this interval not displayed.    Basic Metabolic Panel: Recent Labs  Lab 08/31/2020 2036 08/30/2020 2036 08/27/2020 2102 09/03/2020 2103 08/19/20 0050 08/19/20 0050 08/19/20 0400 08/19/20 0400 08/19/20 0939 08/19/20 1215 08/19/20 2227 08/20/20 0132 08/20/20 0755  NA 140   < > 139  139   < > 138   < > 136   < > 137 138 135 131* 134*  K 2.4*   < > 2.3*  2.3*   < > 3.4*   < > 3.7  --  5.2* 3.2*  --  3.1* 3.5  CL 98  --  98  98  --  98  --   --   --   --  103  --  97*  --   CO2 17*  --   --   --  11*  --   --   --   --  17*  --  16*  --   GLUCOSE 314*  --  304*  304*  --  221*  --   --   --   --  183*  --  294*  --   BUN 6  --  4*  4*  --  9  --   --   --   --  14  --  23*  --   CREATININE 1.74*  --  1.90*  1.90*  --  1.93*  --   --   --   --  3.49*  --  4.97*  --   CALCIUM 7.6*  --   --   --  7.4*  --   --   --   --  7.8*  --  6.6*  --   MG 3.2*  --   --   --  2.9*  --   --   --   --   --   --   --   --   PHOS  --   --   --   --  9.9*  --   --   --   --   --   --   --   --    < > = values in this interval not displayed.   GFR: Estimated Creatinine Clearance: 22.5 mL/min (A) (by C-G formula based on SCr of 4.97 mg/dL (H)). Recent Labs  Lab 08/17/2020 2036 08/19/20 0050 08/19/20 1215 08/20/20 0132  WBC 6.7 7.9 2.9* 3.8*  LATICACIDVEN >11.0* >11.0* 1.5  --     Liver Function Tests: Recent Labs  Lab 08/11/2020 2036 08/19/20 0050 08/20/20 0132  AST 362* 1,278* 575*  ALT 173* 331* 213*  ALKPHOS 112 242* 114  BILITOT 0.5 0.9 0.6  PROT 5.5* 6.1* 4.5*  ALBUMIN 2.8* 3.3* 2.2*   No results for input(s): LIPASE, AMYLASE in the last 168 hours. Recent Labs  Lab 08/19/20 0050  AMMONIA 109*    ABG    Component Value Date/Time    PHART 7.311 (L) 08/20/2020 0755   PCO2ART 35.2 08/20/2020 0755   PO2ART 90 08/20/2020 0755   HCO3 17.8 (L) 08/20/2020 0755   TCO2 19 (L) 08/20/2020 0755   ACIDBASEDEF 8.0 (H) 08/20/2020 0755   O2SAT 96.0 08/20/2020 0755     Coagulation Profile: Recent Labs  Lab 09/09/2020 2036  INR 1.3*    Cardiac Enzymes: Recent Labs  Lab 08/19/20 0050  CKTOTAL 1,484*    HbA1C: Hgb A1c MFr Bld  Date/Time Value Ref Range Status  08/19/2020 12:50 AM 7.1 (H) 4.8 - 5.6 % Final    Comment:    (NOTE) Pre diabetes:          5.7%-6.4%  Diabetes:              >6.4%  Glycemic control for   <7.0% adults with diabetes     CBG: Recent Labs  Lab 08/19/20 1540 08/19/20 2020 08/20/20 0028 08/20/20 0357 08/20/20 0746  GLUCAP 269* 279* 297* 219* 198*     This patient is critically ill with multiple organ system failure which requires frequent high complexity decision making, assessment, support, evaluation, and titration of therapies. This was completed through the application of advanced monitoring technologies and extensive interpretation of multiple databases. During this encounter critical care time was devoted to patient care services described in this note for 70 minutes.  Steffanie Dunn, DO 08/20/20 3:20 PM [facility]

## 2020-08-20 NOTE — TOC Progression Note (Signed)
Transition of Care [facility]) - Progression Note    Patient Details  Name: Henry Lynn MRN: [id number] Date of Birth: July 30, 1976  [facility]) CM/SW Contact  Lockie Pares, RN Phone Number: 08/20/2020, 12:00 PM  Clinical Narrative:    Found one more number Danie Binder Left message on [phone]         Expected Discharge Plan and Services                                                 Social Determinants of Health (SDOH) Interventions    Readmission Risk Interventions No flowsheet data found.

## 2020-08-20 NOTE — Progress Notes (Signed)
eLink Physician-Brief Progress Note Patient Name: Henry Lynn DOB: [birth year] MRN: [id number]   Date of Service  08/20/2020  HPI/Events of Note  K+ 3.1, GFR 13  eICU Interventions  KCL 20 meq iv over 2 hours x 1        Malayah Demuro U Ramy Greth 08/20/2020, 4:01 AM

## 2020-08-20 NOTE — Plan of Care (Signed)
  Problem: Skin Integrity: Goal: Risk for impaired skin integrity will be minimized. Outcome: Progressing   Problem: Clinical Measurements: Goal: Diagnostic test results will improve Outcome: Not Progressing Goal: Cardiovascular complication will be avoided Outcome: Not Progressing Note: Now requiring 4 pressors to maintain adequate BP   Problem: Elimination: Goal: Will not experience complications related to urinary retention Outcome: Not Progressing   Problem: Neurologic: Goal: Promote progressive neurologic recovery Outcome: Not Progressing

## 2020-08-20 NOTE — TOC Progression Note (Signed)
Transition of Care [facility]) - Progression Note    Patient Details  Name: Henry Lynn MRN: [id number] Date of Birth: 02-21-1976  [facility] CM/SW Contact  Lockie Pares, RN Phone Number: 08/20/2020, 12:32 PM  Clinical Narrative:    [place] [employer], Patient grew up in [place]. Officer took down my information, she stated that there are several police officers that know him and his family and they will give me a call back shortly.         Expected Discharge Plan and Services                                                 Social Determinants of Health (SDOH) Interventions    Readmission Risk Interventions No flowsheet data found.

## 2020-08-20 NOTE — Progress Notes (Signed)
Spoke with Nicholaus Bloom (aunt) and Salena Saner (cousin) who lives in [place], [place]. Enlightened with information regarding visitor, Elmarie Shiley, claiming to be pts sister. Darel Hong also gave information that pt has roughly 8 children some of which are over 46 years old who legally can make decisions. Darel Hong currently working on getting information to get in contact with children. Oldest child (age 13) known to have passed away last year; however, he has other children we are trying to get in contact with. Darel Hong also confirmed pt has two brothers of the names Shilid and Ines Bloomer both living in [place]. Spoke with Shilid on phone earlier in day regarding pt condition and with Dr. Chestine Spore on phone made pt DNR.    Tiffany visited earlier this afternoon claiming to be pts sister.Talked with Tiffany on phone with Alcario Drought RN present to witness Elmarie Shiley confirming she is not physically related to pt but she calls herself the pts sister. Tiffany also mentioned Duwayne Heck, who is Tiffany's sister, who the pt dated for 10 years and claims the pt to be family as well.   Darel Hong and Sue Lush currently traveling from [place] and Shilid and wife currently traveling from [place] both stating to be here this evening.   Contact info for [employer] in demographics. Do not contact Tiffany for pt information.

## 2020-08-21 LAB — POCT I-STAT 7, (LYTES, BLD GAS, ICA,H+H)
Acid-base deficit: 2 mmol/L (ref 0.0–2.0)
Acid-base deficit: 2 mmol/L (ref 0.0–2.0)
Acid-base deficit: 5 mmol/L — ABNORMAL HIGH (ref 0.0–2.0)
Acid-base deficit: 6 mmol/L — ABNORMAL HIGH (ref 0.0–2.0)
Bicarbonate: 18.2 mmol/L — ABNORMAL LOW (ref 20.0–28.0)
Bicarbonate: 18.4 mmol/L — ABNORMAL LOW (ref 20.0–28.0)
Bicarbonate: 21 mmol/L (ref 20.0–28.0)
Bicarbonate: 25.7 mmol/L (ref 20.0–28.0)
Calcium, Ion: 0.84 mmol/L — CL (ref 1.15–1.40)
Calcium, Ion: 0.86 mmol/L — CL (ref 1.15–1.40)
Calcium, Ion: 0.87 mmol/L — CL (ref 1.15–1.40)
Calcium, Ion: 0.9 mmol/L — ABNORMAL LOW (ref 1.15–1.40)
HCT: 28 % — ABNORMAL LOW (ref 39.0–52.0)
HCT: 29 % — ABNORMAL LOW (ref 39.0–52.0)
HCT: 30 % — ABNORMAL LOW (ref 39.0–52.0)
HCT: 30 % — ABNORMAL LOW (ref 39.0–52.0)
Hemoglobin: 10.2 g/dL — ABNORMAL LOW (ref 13.0–17.0)
Hemoglobin: 10.2 g/dL — ABNORMAL LOW (ref 13.0–17.0)
Hemoglobin: 9.5 g/dL — ABNORMAL LOW (ref 13.0–17.0)
Hemoglobin: 9.9 g/dL — ABNORMAL LOW (ref 13.0–17.0)
O2 Saturation: 100 %
O2 Saturation: 100 %
O2 Saturation: 93 %
O2 Saturation: 95 %
Patient temperature: 37
Patient temperature: 37
Patient temperature: 37
Patient temperature: 37
Potassium: 4.2 mmol/L (ref 3.5–5.1)
Potassium: 4.5 mmol/L (ref 3.5–5.1)
Potassium: 4.7 mmol/L (ref 3.5–5.1)
Potassium: 4.9 mmol/L (ref 3.5–5.1)
Sodium: 133 mmol/L — ABNORMAL LOW (ref 135–145)
Sodium: 133 mmol/L — ABNORMAL LOW (ref 135–145)
Sodium: 133 mmol/L — ABNORMAL LOW (ref 135–145)
Sodium: 134 mmol/L — ABNORMAL LOW (ref 135–145)
TCO2: 19 mmol/L — ABNORMAL LOW (ref 22–32)
TCO2: 19 mmol/L — ABNORMAL LOW (ref 22–32)
TCO2: 22 mmol/L (ref 22–32)
TCO2: 27 mmol/L (ref 22–32)
pCO2 arterial: 28.8 mmHg — ABNORMAL LOW (ref 32.0–48.0)
pCO2 arterial: 29.1 mmHg — ABNORMAL LOW (ref 32.0–48.0)
pCO2 arterial: 30.6 mmHg — ABNORMAL LOW (ref 32.0–48.0)
pCO2 arterial: 56.6 mmHg — ABNORMAL HIGH (ref 32.0–48.0)
pH, Arterial: 7.265 — ABNORMAL LOW (ref 7.350–7.450)
pH, Arterial: 7.404 (ref 7.350–7.450)
pH, Arterial: 7.415 (ref 7.350–7.450)
pH, Arterial: 7.445 (ref 7.350–7.450)
pO2, Arterial: 215 mmHg — ABNORMAL HIGH (ref 83.0–108.0)
pO2, Arterial: 245 mmHg — ABNORMAL HIGH (ref 83.0–108.0)
pO2, Arterial: 67 mmHg — ABNORMAL LOW (ref 83.0–108.0)
pO2, Arterial: 75 mmHg — ABNORMAL LOW (ref 83.0–108.0)

## 2020-08-21 LAB — CBC
HCT: 32.1 % — ABNORMAL LOW (ref 39.0–52.0)
Hemoglobin: 10.5 g/dL — ABNORMAL LOW (ref 13.0–17.0)
MCH: 29.2 pg (ref 26.0–34.0)
MCHC: 32.7 g/dL (ref 30.0–36.0)
MCV: 89.4 fL (ref 80.0–100.0)
Platelets: 98 10*3/uL — ABNORMAL LOW (ref 150–400)
RBC: 3.59 MIL/uL — ABNORMAL LOW (ref 4.22–5.81)
RDW: 17.5 % — ABNORMAL HIGH (ref 11.5–15.5)
WBC: 6.8 10*3/uL (ref 4.0–10.5)
nRBC: 0 % (ref 0.0–0.2)

## 2020-08-21 LAB — COMPREHENSIVE METABOLIC PANEL
ALT: 154 U/L — ABNORMAL HIGH (ref 0–44)
AST: 169 U/L — ABNORMAL HIGH (ref 15–41)
Albumin: 1.8 g/dL — ABNORMAL LOW (ref 3.5–5.0)
Alkaline Phosphatase: 124 U/L (ref 38–126)
Anion gap: 20 — ABNORMAL HIGH (ref 5–15)
BUN: 32 mg/dL — ABNORMAL HIGH (ref 6–20)
CO2: 17 mmol/L — ABNORMAL LOW (ref 22–32)
Calcium: 6.6 mg/dL — ABNORMAL LOW (ref 8.9–10.3)
Chloride: 98 mmol/L (ref 98–111)
Creatinine, Ser: 6.82 mg/dL — ABNORMAL HIGH (ref 0.61–1.24)
GFR, Estimated: 9 mL/min — ABNORMAL LOW (ref >60)
Glucose, Bld: 153 mg/dL — ABNORMAL HIGH (ref 70–99)
Potassium: 4.5 mmol/L (ref 3.5–5.1)
Sodium: 135 mmol/L (ref 135–145)
Total Bilirubin: 1.3 mg/dL — ABNORMAL HIGH (ref 0.3–1.2)
Total Protein: 4.7 g/dL — ABNORMAL LOW (ref 6.5–8.1)

## 2020-08-21 LAB — CULTURE, RESPIRATORY W GRAM STAIN: Culture: NORMAL

## 2020-08-21 LAB — GLUCOSE, CAPILLARY
Glucose-Capillary: 146 mg/dL — ABNORMAL HIGH (ref 70–99)
Glucose-Capillary: 154 mg/dL — ABNORMAL HIGH (ref 70–99)
Glucose-Capillary: 182 mg/dL — ABNORMAL HIGH (ref 70–99)

## 2020-08-21 LAB — OSMOLALITY
Osmolality: 316 mOsm/kg — ABNORMAL HIGH (ref 275–295)
Osmolality: 314 mOsm/kg — ABNORMAL HIGH (ref 275–295)

## 2020-08-21 LAB — SODIUM: Sodium: 134 mmol/L — ABNORMAL LOW (ref 135–145)

## 2020-08-21 MED ORDER — CALCIUM GLUCONATE-NACL 1-0.675 GM/50ML-% IV SOLN
1.0000 g | Freq: Once | INTRAVENOUS | Status: AC
  Administered 2020-08-21: 1000 mg via INTRAVENOUS
  Filled 2020-08-21: qty 50

## 2020-08-21 MED ORDER — SODIUM CHLORIDE 0.9 % IV SOLN
INTRAVENOUS | Status: DC
  Administered 2020-08-21: 1000 mL via INTRAVENOUS

## 2020-08-22 LAB — MISC LABCORP TEST (SEND OUT): Labcorp test code: 71543

## 2020-08-24 LAB — CULTURE, BLOOD (ROUTINE X 2)
Culture: NO GROWTH
Culture: NO GROWTH
Special Requests: ADEQUATE
Special Requests: ADEQUATE

## 2020-09-11 NOTE — Progress Notes (Signed)
Nutrition Brief Note  Chart reviewed. No escalation of care per CCM. Pt pronounced dead by neurological criteria at 1139 today. No nutrition interventions warranted at this time. Please consult as needed.    Earma Reading, MS, RD, LDN Inpatient Clinical Dietitian Please see [facility] for contact information.

## 2020-09-11 NOTE — Progress Notes (Signed)
eLink Physician-Brief Progress Note Patient Name: Henry Lynn DOB: 02-Apr-1976 MRN: [id number]   Date of Service  08/24/2020  HPI/Events of Note  Ionized Ca++ = 0.86.  eICU Interventions  Will replace Ca++.      Intervention Category Major Interventions: Electrolyte abnormality - evaluation and management  [name] [place] 08/26/2020, 6:42 AM

## 2020-09-11 NOTE — Progress Notes (Signed)
Ok Anis at the bedside.  Elink notified  To page ground team to the bedside.  2115Debbe Bales, NP at the bedside  Prior to family leaving for the night, Darel Hong and Sue Lush confirmed that [place], [place] and possibly [employer]. (Son) are making arrangements to come down and hope to be here by tomorrow.   Per Darel Hong, she think that only Cloyde Reams. Will likely be the only one of his children who will be able to come into town.

## 2020-09-11 NOTE — Progress Notes (Signed)
Apnea test done with MD and RN at bedside. Oxygen tubing was placed down ETT tube at 8L for 8 minutes. Cuff was deflated. ABG was obtained before and after.

## 2020-09-11 NOTE — Death Summary Note (Signed)
DEATH SUMMARY   Patient Details  Name: Henry Lynn MRN: [id number] DOB: 13-Jul-1976  Admission/Discharge Information   Admit Date:  19-Aug-2020  Date of Death: Date of Death: 08-22-2020  Time of Death: Time of Death: 1138/03/11  Length of Stay: 3  Referring Physician: No primary care provider on file.   Reason(s) for Hospitalization  Cardiac arrest due to presumed drug overdose.  Diagnoses  Preliminary cause of death:  Secondary Diagnoses (including complications and co-morbidities):  Active Problems:   Cardiac arrest [facility])   Brief Hospital Course (including significant findings, care, treatment, and services provided and events leading to death)  44 y/o M who came in as Henry Lynn with unknown PMH who had witnessed out of hospital cardiac arrest. Witnesses called EMS, no CPR performed prior to EMS arrival with unknown duration of downtime prior to EMS arrival. Presenting rhythm asystole, 30 min ACLS in field, 20-30 min ACLS performed here. On presentation to ED here, initially in PEA arrest, later pulseless VT defibrillated x3, given amio boluses then infusion. 1L saline given during arrest, currently infusing second liter, couple amps of bicarb. He is on epi and levo drips at time of my evaluation. He remains unresponsive with fixed pupils.   [place] [employer] notified to fingerprint.  Despite maximal aggressive care patient progressed to brain death from anoxic brain injury.  Family notified and was at bedside at time of death.   Pertinent Labs and Studies  Significant Diagnostic Studies DG Abd 1 View  Result Date: 08/19/2020 CLINICAL DATA:  OG tube placement EXAM: ABDOMEN - 1 VIEW COMPARISON:  None. FINDINGS: The tip of the OG tube projects over the gastric body/fundus. The bowel gas pattern is unremarkable where visualized. There appears to be a large amount of stool in the transverse colon. IMPRESSION: OG tube projects over the gastric body/fundus. Electronically Signed   By: Katherine Mantle  M.D.   On: 08/19/2020 01:10   CT HEAD WO CONTRAST  Result Date: 08/19/2020 CLINICAL DATA:  Anoxic brain damage. Question herniation. Cardiac arrest. EXAM: CT HEAD WITHOUT CONTRAST TECHNIQUE: Contiguous axial images were obtained from the base of the skull through the vertex without intravenous contrast. COMPARISON:  CT head without contrast 2020-08-19. FINDINGS: Brain: Sulci are effaced. Ventricles are near completely effaced. The basal cisterns are effaced. Cerebellar tonsils extend at least 11 mm below the foramen magnum. Pseudo subarachnoid hemorrhage is present with apposition of cortex and diffuse edema resulting in areas of focal high density. No focal hemorrhage or mass lesion is present. No focal cortical infarct is evident. Vascular: No hyperdense vessel or unexpected calcification. Skull: Calvarium is intact. No focal lytic or blastic lesions are present. No significant extracranial soft tissue lesion is present. Sinuses/Orbits: Scattered mucosal thickening is present throughout ethmoid air cells. Polyp or mucous retention cyst is present in the inferior left frontal sinus. The paranasal sinuses and mastoid air cells are otherwise clear. The globes and orbits are within normal limits. IMPRESSION: 1. Pseudo subarachnoid hemorrhage consistent with diffuse cerebral edema and increased intracranial pressure. 2. Diffuse cerebral edema with effacement of the basal cisterns and ventricles. Findings are consistent with global hypoxic injury. 3. Downward herniation of the brainstem. Cerebellar tonsils extend at least 11 mm below the foramen magnum. Critical Value/emergent results were called by telephone at the time of interpretation on 08/19/2020 at 9:56pm to provider Dr. Warrick Parisian, who verbally acknowledged these results. Electronically Signed   By: Marin Roberts M.D.   On: 08/19/2020 22:03   CT Head Wo  Contrast  Result Date: 08/17/2020 CLINICAL DATA:  Altered mental status. EXAM: CT HEAD WITHOUT  CONTRAST TECHNIQUE: Contiguous axial images were obtained from the base of the skull through the vertex without intravenous contrast. COMPARISON:  None. FINDINGS: Brain: There is diffuse loss of gray-white matter discrimination consistent with hypoxic-ischemic encephalopathy. Clinical correlation is recommended. There is no acute intracranial hemorrhage. No midline shift. No extra-axial fluid collection. Vascular: No hyperdense vessel or unexpected calcification. Skull: Normal. Negative for fracture or focal lesion. Sinuses/Orbits: There is diffuse mucoperiosteal thickening of paranasal sinuses and ethmoid air cells. No air-fluid level. The mastoid air cells are clear. There is opacification of the nasopharynx. Partially visualized enteric tube. Other: None IMPRESSION: Findings consistent with hypoxic-ischemic encephalopathy. Clinical correlation is recommended. No acute intracranial hemorrhage. These results were called by telephone at the time of interpretation on 08/23/2020 at 9:41 pm to provider [facility] , who verbally acknowledged these results. Electronically Signed   By: Elgie Collard M.D.   On: 09/06/2020 21:50   CT Angio Chest PE W and/or Wo Contrast  Result Date: 09/10/2020 CLINICAL DATA:  PE suspected, high probability. EXAM: CT ANGIOGRAPHY CHEST WITH CONTRAST TECHNIQUE: Multidetector CT imaging of the chest was performed using the standard protocol during bolus administration of intravenous contrast. Multiplanar CT image reconstructions and MIPs were obtained to evaluate the vascular anatomy. CONTRAST:  OMNIPAQUE IOHEXOL 350 MG/ML SOLN COMPARISON:  None. FINDINGS: Cardiovascular: The heart size is normal. Aorta and great vessel origins are within normal limits. Pulmonary artery opacification is satisfactory. No focal filling defect scratched at no focal filling defects are present to suggest pulmonary embolus. Mediastinum/Nodes: The patient is intubated. Enteric tube is in place.  Significant mediastinal or hilar adenopathy is present. Lungs/Pleura: Small effusions are present bilaterally. Associated atelectasis is present. No other significant airspace disease or consolidation present. No pneumothorax is present. Upper Abdomen: Visualized upper abdomen is unremarkable. Musculoskeletal: Vertebral body heights and alignment are normal. No focal lytic or blastic lesions are present. Ribs are unremarkable. Review of the MIP images confirms the above findings. IMPRESSION: 1. No pulmonary embolus. 2. Small bilateral pleural effusions with associated atelectasis. Electronically Signed   By: Marin Roberts M.D.   On: 09/05/2020 21:37   DG Chest Portable 1 View  Result Date: 08/26/2020 CLINICAL DATA:  Check endotracheal tube placement EXAM: PORTABLE CHEST 1 VIEW COMPARISON:  None. FINDINGS: Endotracheal tube is noted in satisfactory position. Gastric catheter is noted within the stomach. Heart is mildly prominent but accentuated by the frontal technique. Poor inspiratory effort is noted with crowding of the vascular markings. There may be some mild vascular congestion. IMPRESSION: Endotracheal tube and gastric catheter in satisfactory position. Poor inspiratory effort with crowding of the vascular markings. Mild vascular congestion may be present. Electronically Signed   By: Alcide Clever M.D.   On: 09/03/2020 21:42   EEG adult  Result Date: 08/19/2020 Charlsie Quest, MD     08/19/2020  6:32 PM ient Name: Henry Lynn  MRN: [id number] Epilepsy Attending: Charlsie Quest Referring Physician/Provider: Dr Karie Fetch Date: 08/19/2020 Duration:  29.14 mins Patient history: unknown age male s/p cardiac arrest. EEG to evaluate for seizure. Level of alertness: Comatose AEDs during EEG study: None Technical aspects: This EEG study was done with scalp electrodes positioned according to the 10-20 International system of electrode placement. Electrical activity was acquired at a sampling rate  of 500Hz  and reviewed with a high frequency filter of 70Hz  and a low frequency filter of  . EEG data were recorded continuously and digitally stored. Description:  EEG showed continuous generalized background suppression. EEG not reactive to noxiuos stimulation. Hyperventilation and photic stimulation were not performed.   ABNORMALITY -Background suppression, generalized IMPRESSION: This study is suggestive of profound diffuse encephalopathy, nonspecific etiology but likely related to anoxic/hypoxic brain injury. No seizures or epileptiform discharges were seen throughout the recording. Charlsie Quest   ECHOCARDIOGRAM COMPLETE  Result Date: 08/19/2020    ECHOCARDIOGRAM REPORT   Patient Name:   Henry Lynn Date of Exam: 08/19/2020 Medical Rec #:  [id number]       Height:       70.0 in Accession #:    [id number]      Weight:       218.7 lb Date of Birth:  11/11/1877      BSA:          2.168 m Patient Age:    89 years       BP:           86/62 mmHg Patient Gender: M               HR:           96 bpm. Exam Location:  Inpatient Procedure: 2D Echo, 3D Echo and Strain Analysis Indications:    Cardiac arrest I46.9  History:        Patient has no prior history of Echocardiogram examinations. S/P                 asystole to VT arrest with prolonged down time, shock, Acute                 encephalopathy due to anoxic brain injury.  Sonographer:    Leta Jungling RDCS Referring Phys: [id number] Ivor Costa MEIER IMPRESSIONS  1. Anterior hypokinesis. Left ventricular ejection fraction, by estimation, is 50 to 55%. The left ventricle has low normal function. The left ventricle demonstrates regional wall motion abnormalities (see scoring diagram/findings for description). There is moderate concentric left ventricular hypertrophy. Left ventricular diastolic parameters are consistent with Grade II diastolic dysfunction (pseudonormalization). The average left ventricular global longitudinal strain is -6.7 %. The global  longitudinal strain is abnormal.  2. Right ventricular systolic function is normal. The right ventricular size is normal. There is normal pulmonary artery systolic pressure.  3. The mitral valve is normal in structure. No evidence of mitral valve regurgitation. No evidence of mitral stenosis.  4. The aortic valve is tricuspid. Aortic valve regurgitation is not visualized. No aortic stenosis is present.  5. The inferior vena cava is normal in size with greater than 50% respiratory variability, suggesting right atrial pressure of 3 mmHg. FINDINGS  Left Ventricle: Anterior hypokinesis. Left ventricular ejection fraction, by estimation, is 50 to 55%. The left ventricle has low normal function. The left ventricle demonstrates regional wall motion abnormalities. The average left ventricular global longitudinal strain is -6.7 %. The global longitudinal strain is abnormal. The left ventricular internal cavity size was normal in size. There is moderate concentric left ventricular hypertrophy. Left ventricular diastolic parameters are consistent with Grade II diastolic dysfunction (pseudonormalization). Indeterminate filling pressures. Right Ventricle: The right ventricular size is normal. No increase in right ventricular wall thickness. Right ventricular systolic function is normal. There is normal pulmonary artery systolic pressure. The tricuspid regurgitant velocity is 2.17 m/s, and  with an assumed right atrial pressure of 3 mmHg, the estimated right ventricular systolic pressure is 21.8 mmHg. Left Atrium: Left  atrial size was normal in size. Right Atrium: Right atrial size was normal in size. Pericardium: Trivial pericardial effusion is present. Mitral Valve: The mitral valve is normal in structure. No evidence of mitral valve regurgitation. No evidence of mitral valve stenosis. Tricuspid Valve: The tricuspid valve is normal in structure. Tricuspid valve regurgitation is trivial. No evidence of tricuspid stenosis. Aortic  Valve: The aortic valve is tricuspid. Aortic valve regurgitation is not visualized. No aortic stenosis is present. Pulmonic Valve: The pulmonic valve was normal in structure. Pulmonic valve regurgitation is not visualized. No evidence of pulmonic stenosis. Aorta: The aortic root is normal in size and structure. Venous: The inferior vena cava is normal in size with greater than 50% respiratory variability, suggesting right atrial pressure of 3 mmHg. IAS/Shunts: No atrial level shunt detected by color flow Doppler.  LEFT VENTRICLE PLAX 2D LVIDd:         5.10 cm  Diastology LVIDs:         4.40 cm  LV e' medial:    5.92 cm/s LV PW:         1.50 cm  LV E/e' medial:  9.5 LV IVS:        1.40 cm  LV e' lateral:   3.49 cm/s LVOT diam:     2.50 cm  LV E/e' lateral: 16.1 LV SV:         51 LV SV Index:   23       2D Longitudinal Strain LVOT Area:     4.91 cm 2D Strain GLS (A2C):   -6.4 %                         2D Strain GLS (A3C):   -6.9 %                         2D Strain GLS (A4C):   -6.9 %                         2D Strain GLS Avg:     -6.7 %                          3D Volume EF:                         3D EF:        52 %                         LV EDV:       186 ml                         LV ESV:       90 ml                         LV SV:        96 ml RIGHT VENTRICLE RV S prime:     16.30 cm/s TAPSE (M-mode): 2.2 cm LEFT ATRIUM             Index       RIGHT ATRIUM           Index LA diam:        4.40 cm 2.03 cm/m  RA  Area:     11.10 cm LA Vol (A2C):   31.0 ml 14.30 ml/m RA Volume:   18.50 ml  8.53 ml/m LA Vol (A4C):   41.7 ml 19.23 ml/m LA Biplane Vol: 38.6 ml 17.80 ml/m  AORTIC VALVE LVOT Vmax:   97.30 cm/s LVOT Vmean:  58.000 cm/s LVOT VTI:    0.103 m  AORTA Ao Root diam: 3.30 cm MITRAL VALVE               TRICUSPID VALVE MV Area (PHT): 5.46 cm    TR Peak grad:   18.8 mmHg MV Decel Time: 139 msec    TR Vmax:        217.00 cm/s MV E velocity: 56.10 cm/s MV A velocity: 33.10 cm/s  SHUNTS MV E/A ratio:  1.69         Systemic VTI:  0.10 m                            Systemic Diam: 2.50 cm Chilton Si MD Electronically signed by Chilton Si MD Signature Date/Time: 08/19/2020/12:57:56 PM    Final     Microbiology Recent Results (from the past 240 hour(s))  Respiratory Panel by RT PCR (Flu A&B, Covid) - Nasopharyngeal Swab     Status: None   Collection Time: September 01, 2020  8:26 PM   Specimen: Nasopharyngeal Swab  Result Value Ref Range Status   SARS Coronavirus 2 by RT PCR NEGATIVE NEGATIVE Final    Comment: (NOTE) SARS-CoV-2 target nucleic acids are NOT DETECTED.  The SARS-CoV-2 RNA is generally detectable in upper respiratoy specimens during the acute phase of infection. The lowest concentration of SARS-CoV-2 viral copies this assay can detect is 131 copies/mL. A negative result does not preclude SARS-Cov-2 infection and should not be used as the sole basis for treatment or other patient management decisions. A negative result may occur with  improper specimen collection/handling, submission of specimen other than nasopharyngeal swab, presence of viral mutation(s) within the areas targeted by this assay, and inadequate number of viral copies (<131 copies/mL). A negative result must be combined with clinical observations, patient history, and epidemiological information. The expected result is Negative.  Fact Sheet for Patients:  https://www.moore.com/  Fact Sheet for Healthcare Providers:  https://www.young.biz/  This test is no t yet approved or cleared by the [place] FDA and  has been authorized for detection and/or diagnosis of SARS-CoV-2 by FDA under an Emergency Use Authorization (EUA). This EUA will remain  in effect (meaning this test can be used) for the duration of the COVID-19 declaration under Section 564(b)(1) of the Act, 21 U.S.C. section 360bbb-3(b)(1), unless the authorization is terminated or revoked sooner.     Influenza A by  PCR NEGATIVE NEGATIVE Final   Influenza B by PCR NEGATIVE NEGATIVE Final    Comment: (NOTE) The Xpert Xpress SARS-CoV-2/FLU/RSV assay is intended as an aid in  the diagnosis of influenza from Nasopharyngeal swab specimens and  should not be used as a sole basis for treatment. Nasal washings and  aspirates are unacceptable for Xpert Xpress SARS-CoV-2/FLU/RSV  testing.  Fact Sheet for Patients: https://www.moore.com/  Fact Sheet for Healthcare Providers: https://www.young.biz/  This test is not yet approved or cleared by the [place] FDA and  has been authorized for detection and/or diagnosis of SARS-CoV-2 by  FDA under an Emergency Use Authorization (EUA). This EUA will remain  in effect (meaning this test can be used) for  the duration of the  Covid-19 declaration under Section 564(b)(1) of the Act, 21  U.S.C. section 360bbb-3(b)(1), unless the authorization is  terminated or revoked. Performed at [facility], [address] [place], [place] [address]   Culture, blood (routine x 2)     Status: None (Preliminary result)   Collection Time: 08/19/20 12:51 AM   Specimen: BLOOD LEFT HAND  Result Value Ref Range Status   Specimen Description BLOOD LEFT HAND  Final   Special Requests   Final    BOTTLES DRAWN AEROBIC AND ANAEROBIC Blood Culture adequate volume   Culture   Final    NO GROWTH 3 DAYS Performed at [facility], [address] [place], [place] [address]    Report Status PENDING  Incomplete  Culture, blood (routine x 2)     Status: None (Preliminary result)   Collection Time: 08/19/20 12:57 AM   Specimen: BLOOD RIGHT HAND  Result Value Ref Range Status   Specimen Description BLOOD RIGHT HAND  Final   Special Requests   Final    BOTTLES DRAWN AEROBIC ONLY Blood Culture adequate volume   Culture   Final    NO GROWTH 3 DAYS Performed at [facility], [address] [place], [place] [address]    Report  Status PENDING  Incomplete  MRSA PCR Screening     Status: None   Collection Time: 08/19/20  3:40 AM   Specimen: Nasopharyngeal  Result Value Ref Range Status   MRSA by PCR NEGATIVE NEGATIVE Final    Comment:        The GeneXpert MRSA Assay (FDA approved for NASAL specimens only), is one component of a comprehensive MRSA colonization surveillance program. It is not intended to diagnose MRSA infection nor to guide or monitor treatment for MRSA infections. Performed at [facility], [address], [place], [place] [address]   Culture, respiratory (non-expectorated)     Status: None   Collection Time: 08/19/20 10:58 AM   Specimen: Tracheal Aspirate; Respiratory  Result Value Ref Range Status   Specimen Description TRACHEAL ASPIRATE  Final   Special Requests NONE  Final   Gram Stain   Final    RARE WBC PRESENT,BOTH PMN AND MONONUCLEAR MODERATE GRAM POSITIVE COCCI IN PAIRS FEW GRAM NEGATIVE COCCOBACILLI    Culture   Final    FEW Consistent with normal respiratory flora. No Pseudomonas species isolated Performed at [facility], [address], [place], [place] [address]    Report Status 09/03/2020 FINAL  Final    Lab Basic Metabolic Panel: Recent Labs  Lab 09/04/2020 2036 09/10/2020 2036 08/17/2020 2102 08/22/2020 2103 08/19/20 0050 08/19/20 0400 08/19/20 1215 08/19/20 2227 08/20/20 0132 08/20/20 0755 09-03-20 0034 09-03-2020 0034 September 03, 2020 0042 09/03/2020 0419 09-03-20 0524 September 03, 2020 1122 09-03-20 1138  NA 140   < > 139  139   < > 138   < > 138   < > 131*   < > 133*   < > 134* 135 133* 134* 133*  K 2.4*   < > 2.3*  2.3*   < > 3.4*   < > 3.2*  --  3.1*   < > 4.2  --   --  4.5 4.5 4.7 4.9  CL 98   < > 98  98  --  98  --  103  --  97*  --   --   --   --  98  --   --   --  CO2 17*  --   --   --  11*  --  17*  --  16*  --   --   --   --  17*  --   --   --   GLUCOSE 314*   < > 304*  304*  --  221*  --  183*  --  294*  --   --   --   --  153*  --   --   --    BUN 6   < > 4*  4*  --  9  --  14  --  23*  --   --   --   --  32*  --   --   --   CREATININE 1.74*   < > 1.90*  1.90*  --  1.93*  --  3.49*  --  4.97*  --   --   --   --  6.82*  --   --   --   CALCIUM 7.6*  --   --   --  7.4*  --  7.8*  --  6.6*  --   --   --   --  6.6*  --   --   --   MG 3.2*  --   --   --  2.9*  --   --   --   --   --   --   --   --   --   --   --   --   PHOS  --   --   --   --  9.9*  --   --   --   --   --   --   --   --   --   --   --   --    < > = values in this interval not displayed.   Liver Function Tests: Recent Labs  Lab August 26, 2020 2036 08/19/20 0050 08/20/20 0132 08/20/2020 0419  AST 362* 1,278* 575* 169*  ALT 173* 331* 213* 154*  ALKPHOS 112 242* 114 124  BILITOT 0.5 0.9 0.6 1.3*  PROT 5.5* 6.1* 4.5* 4.7*  ALBUMIN 2.8* 3.3* 2.2* 1.8*   No results for input(s): LIPASE, AMYLASE in the last 168 hours. Recent Labs  Lab 08/19/20 0050  AMMONIA 109*   CBC: Recent Labs  Lab August 26, 2020 2036 08/26/20 2102 08/19/20 0050 08/19/20 0400 08/19/20 1215 08/19/20 1215 08/20/20 0132 08/20/20 0755 09/02/2020 0034 08/13/2020 0419 09/05/2020 0524 08/27/2020 1122 08/15/2020 1138  WBC 6.7  --  7.9  --  2.9*  --  3.8*  --   --  6.8  --   --   --   NEUTROABS 2.1  --  6.2  --   --   --   --   --   --   --   --   --   --   HGB 11.0*   < > 12.0*   < > 12.5*   < > 11.1*   < > 10.2* 10.5* 9.9* 9.5* 10.2*  HCT 35.5*   < > 37.8*   < > 37.7*   < > 33.6*   < > 30.0* 32.1* 29.0* 28.0* 30.0*  MCV 94.2  --  92.0  --  88.5  --  89.1  --   --  89.4  --   --   --   PLT 127*  --  198  --  177  --  170  --   --  98*  --   --   --    < > = values in this interval not displayed.   Cardiac Enzymes: Recent Labs  Lab 08/19/20 0050  CKTOTAL 1,484*   Sepsis Labs: Recent Labs  Lab 09/08/20 2036 08-Sep-2020 2036 08/19/20 0050 08/19/20 1215 08/20/20 0132 08/17/2020 0419  WBC 6.7   < > 7.9 2.9* 3.8* 6.8  LATICACIDVEN >11.0*  --  >11.0* 1.5  --   --    < > = values in this interval not  displayed.     Lorin Glass 08/22/2020, 12:38 PM

## 2020-09-11 NOTE — Progress Notes (Signed)
NAMESarkis Rhines, MRN:  [id number], DOB:  [birth year], LOS: 3 ADMISSION DATE:  02-Sep-2020, CONSULTATION DATE:  2020-09-02 REFERRING MD:  Stevie Kern, CHIEF COMPLAINT:  Witnessed out of hospital cardiac arrest  Brief History   89yM unknown PMH who is s/p asystole to VT arrest with prolonged down time c/b early evidence significant anoxic brain injury  History of present illness   44 y/o M who came in as [name] with unknown PMH who had witnessed out of hospital cardiac arrest. Witnesses called EMS, no CPR performed prior to EMS arrival with unknown duration of downtime prior to EMS arrival. Presenting rhythm asystole, 30 min ACLS in field, 20-30 min ACLS performed here. On presentation to ED here, initially in PEA arrest, later pulseless VT defibrillated x3, given amio boluses then infusion. 1L saline given during arrest, currently infusing second liter, couple amps of bicarb. He is on epi and levo drips at time of my evaluation. He remains unresponsive with fixed pupils.   [place] [employer] notified to fingerprint.  Past Medical History  Unknown  Significant Hospital Events   S/p Asystole -> VT arrest with ROSC   Consults:  PCCM  Procedures:  10/8 R femoral arterial line  Significant Diagnostic Tests:  10/8 CTA chest no PE, small bilateral effusions with atelectasis 10/8 CTH with loss of gray white discrimination diffusely Echo: LVEF 50-55%, G2DD,  EEG ABNORMALITY -Background suppression, generalized   IMPRESSION: This study is suggestive of profound diffuse encephalopathy, nonspecific etiology but likely related to anoxic/hypoxic brain injury. No seizures or epileptiform discharges were seen throughout the recording.   Micro Data:  Trach aspirate > mod GPC, few GNcoccobacilli   Antimicrobials:  10/8 vanc/cefepime >  Interim history/subjective:  gcs 3  Objective   Blood pressure 110/65, pulse 83, temperature 98.4 F (36.9 C), temperature source Bladder, resp. rate (Abnormal) 25,  height 5\' 10"  (1.778 m), weight 103.3 kg, SpO2 94 %. CVP:  [16 mmHg-18 mmHg] 18 mmHg  Vent Mode: PRVC FiO2 (%):  [40 %-50 %] 40 % Set Rate:  [25 bmp] 25 bmp Vt Set:  [580 mL] 580 mL PEEP:  [8 cmH20] 8 cmH20 Plateau Pressure:  [22 cmH20-27 cmH20] 27 cmH20   Intake/Output Summary (Last 24 hours) at 09/10/2020 0928 Last data filed at 08/13/2020 0900 Gross per 24 hour  Intake 6941.36 ml  Output 776 ml  Net 6165.36 ml   Filed Weights   08/19/20 0437 08/20/20 0438 08/16/2020 0500  Weight: 99.2 kg 102.8 kg 103.3 kg    Examination: General 44 year old male unresponsive HENT NCAT orally intubated Pulm crackles bilaterally Card RRR abd soft not tender Ext warm Neuro GCS 3  Resolved Hospital Problem list   n/a  Assessment & Plan:   Cardiac arrest: s/p PEA to VT arrest. Concern for drug overdose. Intoxicated upon arrival. encephalopathy due to anoxic brain injury w/ brain stem herniation and cerebral edema  Shock: likely mixed distributive due to prolonged ischemia, less likely cardiac stunning.  Acute respiratory failure with hypoxia and hypercapnia  Bloody output in OGT- concern for UGIB vs trauma from compressions Lactic acidosis AGMA Fluid and electrolyte imbalance: Hypokalemia, Hyperphosphatemia, Hypocalcemia Diabetes with hyperglycemia -Shock liver Hyperammonemia  Discussion Looks like he is progressing to brain death  Plan Will cont current level of support for now but no escalation Dc all labs Brain death (apnea test)  Awaiting family     Best practice:  Diet: npo Pain/Anxiety/Delirium protocol (if indicated): yes VAP protocol (if indicated): yes DVT  prophylaxis: yes GI prophylaxis: yes Glucose control: SSI Mobility:bed level Code Status: Full Family Communication:    Simonne Martinet ACNP-BC [facility] Pulmonary/Critical Care Pager # [phone] OR # [phone] if no answer

## 2020-09-11 NOTE — Progress Notes (Signed)
Family notified [facility] that they decline organ donation. At this time Zenia Resides, ACNP, gave verbal orders to turn off ventilator and all medications.

## 2020-09-11 NOTE — Progress Notes (Signed)
eLink Physician-Brief Progress Note Patient Name: Henry Lynn DOB: [birth year] MRN: [id number]   Date of Service  08/18/2020  HPI/Events of Note  ICa++ = 0.87.   eICU Interventions  Will replace Ca++.      Intervention Category Major Interventions: Electrolyte abnormality - evaluation and management  Rashonda Warrior Eugene 08/29/2020, 1:07 AM

## 2020-09-11 NOTE — Plan of Care (Signed)
  Problem: Clinical Measurements: Goal: Respiratory complications will improve Outcome: Progressing Goal: Cardiovascular complication will be avoided Outcome: Progressing   Problem: Coping: Goal: Level of anxiety will decrease Outcome: Progressing   Problem: Elimination: Goal: Will not experience complications related to urinary retention Outcome: Progressing   Problem: Pain Managment: Goal: General experience of comfort will improve Outcome: Progressing   Problem: Safety: Goal: Ability to remain free from injury will improve Outcome: Progressing   Problem: Skin Integrity: Goal: Risk for impaired skin integrity will decrease Outcome: Progressing   Problem: Skin Integrity: Goal: Risk for impaired skin integrity will be minimized. Outcome: Progressing

## 2020-09-11 NOTE — Progress Notes (Signed)
   NAMERylin Lynn, MRN:  [id number], DOB:  Mar 25, 1976, LOS: 3 ADMISSION DATE:  08-30-20, CONSULTATION DATE:  August 30, 2020 REFERRING MD:  Stevie Kern, CHIEF COMPLAINT:  Witnessed out of hospital cardiac arrest  Brief History   89yM unknown PMH who is s/p asystole to VT arrest with prolonged down time c/b early evidence significant anoxic brain injury  History of present illness   44 y/o M who came in as Henry Lynn with unknown PMH who had witnessed out of hospital cardiac arrest. Witnesses called EMS, no CPR performed prior to EMS arrival with unknown duration of downtime prior to EMS arrival. Presenting rhythm asystole, 30 min ACLS in field, 20-30 min ACLS performed here. On presentation to ED here, initially in PEA arrest, later pulseless VT defibrillated x3, given amio boluses then infusion. 1L saline given during arrest, currently infusing second liter, couple amps of bicarb. He is on epi and levo drips at time of my evaluation. He remains unresponsive with fixed pupils.   [place] [employer] notified to fingerprint.  Past Medical History  Unknown  Significant Hospital Events   S/p Asystole -> VT arrest with ROSC   Consults:  PCCM  Procedures:  10/8 R femoral arterial line  Significant Diagnostic Tests:  10/8 CTA chest no PE, small bilateral effusions with atelectasis 10/8 CTH with loss of gray white discrimination diffusely Echo: LVEF 50-55%, G2DD,  EEG ABNORMALITY -Background suppression, generalized   IMPRESSION: This study is suggestive of profound diffuse encephalopathy, nonspecific etiology but likely related to anoxic/hypoxic brain injury. No seizures or epileptiform discharges were seen throughout the recording.   Micro Data:  Trach aspirate > mod GPC, few GNcoccobacilli   Antimicrobials:  10/8 vanc/cefepime >  Interim history/subjective:  No events. Comatose on vent.  Objective   Blood pressure 110/65, pulse 83, temperature 98.4 F (36.9 C), temperature source Bladder, resp.  rate (!) 25, height 5\' 10"  (1.778 m), weight 103.3 kg, SpO2 94 %. CVP:  [16 mmHg-18 mmHg] 18 mmHg  Vent Mode: PRVC FiO2 (%):  [40 %-100 %] 100 % Set Rate:  [25 bmp] 25 bmp Vt Set:  [580 mL] 580 mL PEEP:  [8 cmH20] 8 cmH20 Plateau Pressure:  [22 cmH20-27 cmH20] 27 cmH20   Intake/Output Summary (Last 24 hours) at 08/18/2020 0935 Last data filed at 08/31/2020 0900 Gross per 24 hour  Intake 6941.36 ml  Output 776 ml  Net 6165.36 ml   Filed Weights   08/19/20 0437 08/20/20 0438 08/24/2020 0500  Weight: 99.2 kg 102.8 kg 103.3 kg    Examination: Comatose Passive on vent Thick secretions Pupillary/Corneal/oculocephalic/vestibulo-oculoar/cough/gag reflexes absent [facility] Problem list   n/a  Assessment & Plan:   Cardiac arrest: s/p PEA to VT arrest. Concern for drug overdose. Intoxicated upon arrival. Multiorgan failure and brain herniation - Brain death testing today - No escalation of pressors - Continue vent support and abx - DC osmolar agents, there is no role for these in anoxic herniation   The patient is critically ill with multiple organ systems failure and requires high complexity decision making for assessment and support, frequent evaluation and titration of therapies, application of advanced monitoring technologies and extensive interpretation of multiple databases. Critical Care Time devoted to patient care services described in this note independent of APP/resident time (if applicable)  is 34 minutes.   [facility] MD [place] Pulmonary Critical Care 09/03/2020 9:42 AM Personal pager: [phone] If unanswered, please page CCM On-call: #[phone]

## 2020-09-11 NOTE — Progress Notes (Signed)
Adult Brain Death Determination  Time of Examination: 2020-09-20 11:45 AM  A. No Evidence of /Cause of Reversible CNS Depression  1. Core temperature must be greater >36 degrees. Last temp: Temp: 98.6 F (37 C) (Note: If unable to achieve normothermia after 12 hours of temperature management, may consider proceeding with Brain Death Evaluation.):    yes  2. Evidence of severe metabolic perturbations that could potentate CNS depression. Consider glucose, Na, creatinine, PaCO2, SaO2.:    Absent  3. Evidence of drugs, by history or measurement, that could potentiate central nervous system depression: narcotics, ethanol, benzodiazepines, barbiturates, neuromuscular blockade.:     Absent  B. Absence of Cortical Function  1. GCS = 3:    yes  C. Absence of Brain Stem Reflexes and Responses  1. Pupils light-fixed    yes  2. Corneal reflexes:    Absent  3. Response to upper and lower airway stimulation, such as pharyngeal and endotracheal suctioning.:    Absent  4. Ocular response to head turning (eye movement).    Absent  D. Absence of Spontaneous Respirations  (Apnea test performed per Brain Death Policy. If not met due to hemodynamic/ventilatory instability, then perform EEG, TCD, or cerebral blow flow studies.)  1.   Spontaneous Respirations   Absent  2.   PaCO2 at start of apnea test:  30.6  3.   PaCO2 at end of apnea test:  56.6  4.   CO2 rise of 20 or greater from baseline:   yes  E. Document Confirmatory Test Utilized: (Optional) Nuclear cerebral flow, cerebral angiography (CT/MR angio), transcranial Doppler ultrasound, EEG, SSEP (record results).  1. Test results (if available):  CT head herniation  Patient pronounced dead by neurological criteria at 11:39 AM on September 20, 2020.  Candee Furbish, MD September 20, 2020 11:45 AM

## 2020-09-11 DEATH — deceased

## 2021-02-14 IMAGING — CT CT HEAD W/O CM
4 series · 16 of 47 positions shown, 18 images · non-contrast
Comparison: CT head without contrast 08/18/2020.

CLINICAL DATA: Anoxic brain damage. Question herniation. Cardiac
arrest.

EXAM:
CT HEAD WITHOUT CONTRAST
TECHNIQUE: Contiguous axial images were obtained from the base of the skull
through the vertex without intravenous contrast.

[Series 3: head wo · axial · 0.39mm/px · z∈[-132,-7]mm · 7 of 35 slices shown, 9 images]
[im 5/35  brain]
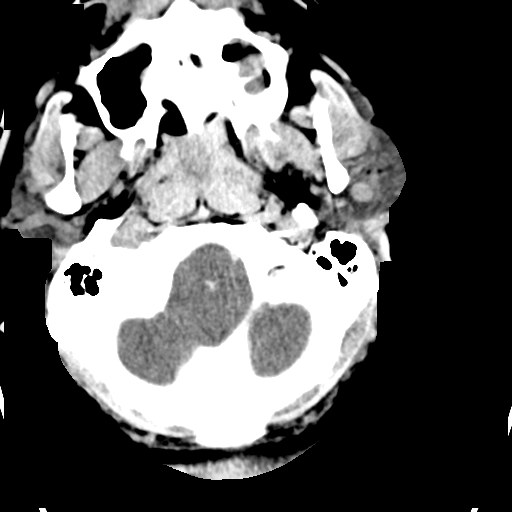
[im 5/35  bone]
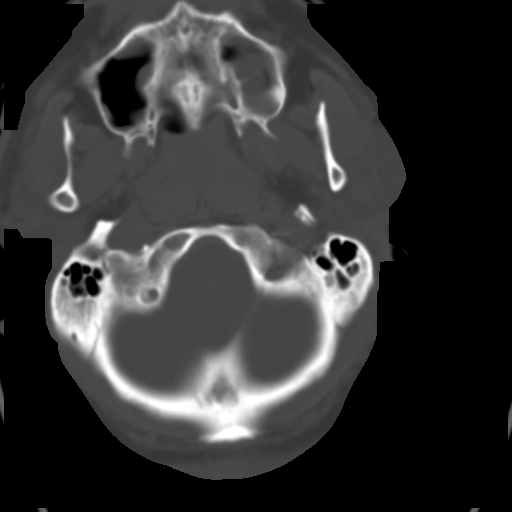
[im 9/35  brain]
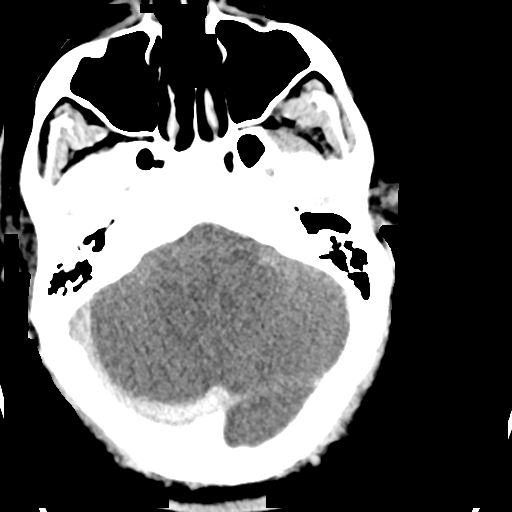
[im 13/35  brain]
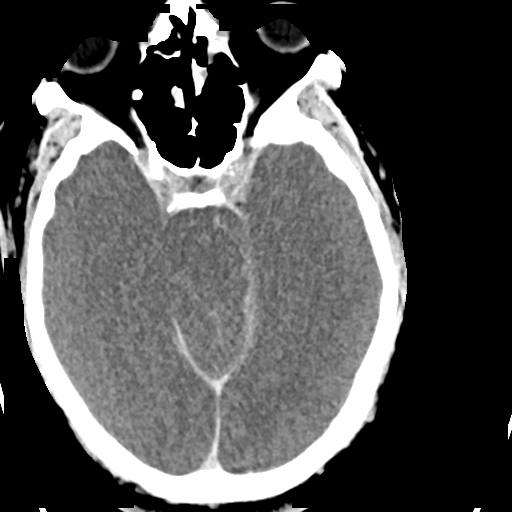
[im 18/35  brain]
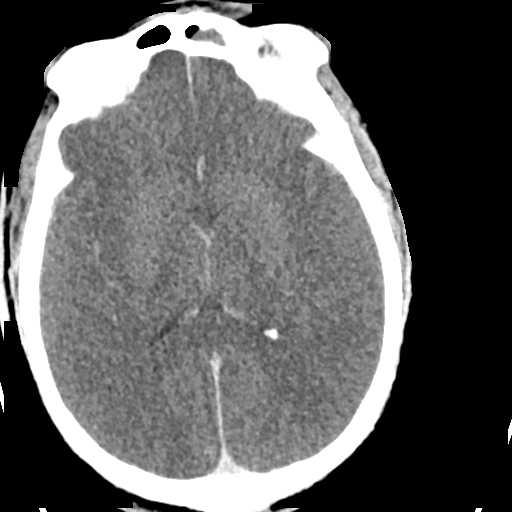
[im 22/35  brain]
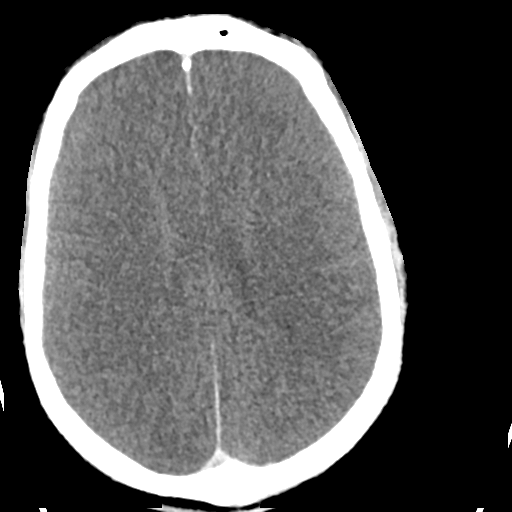
[im 22/35  bone]
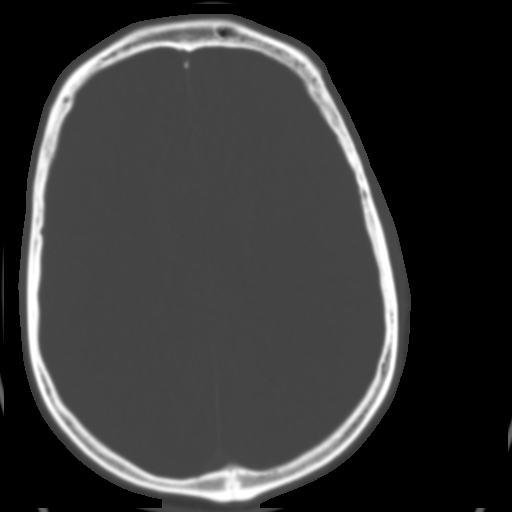
[im 26/35  brain]
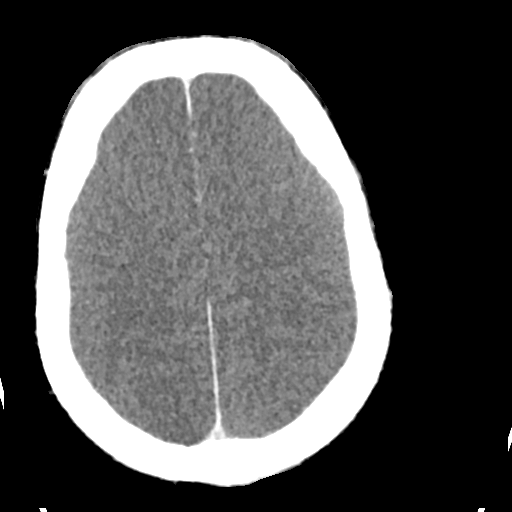
[im 30/35  brain]
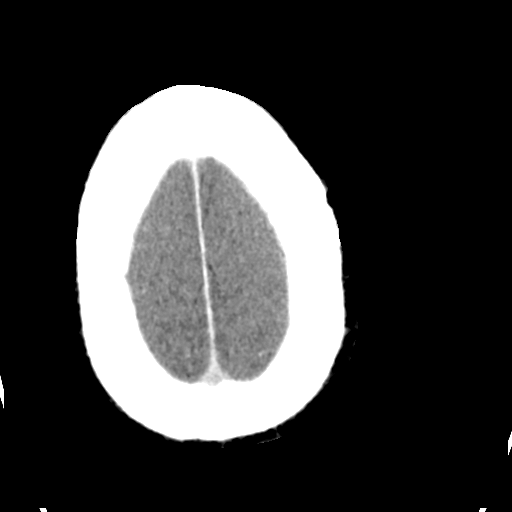

[Series 4: head bone · axial · 0.39mm/px · z∈[-136,-100]mm · 3 of 88 slices shown]
[im 9/88  bone]
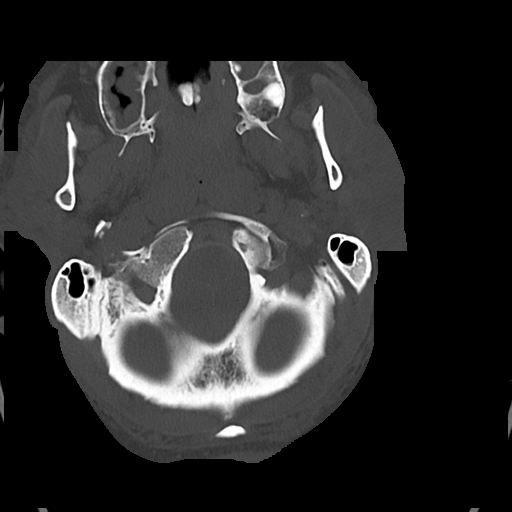
[im 18/88  bone]
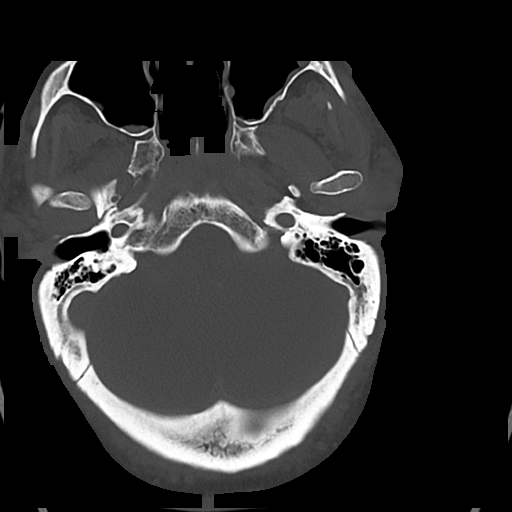
[im 27/88  bone]
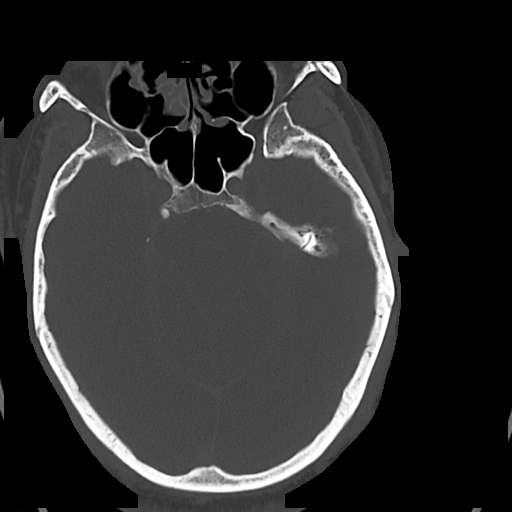

[Series 5: cor soft · coronal · 0.36mm/px · 3 of 90 slices shown]
[im 30/90  brain]
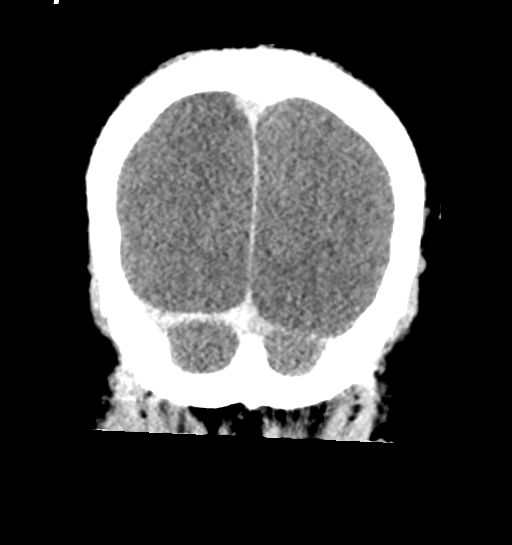
[im 40/90  brain]
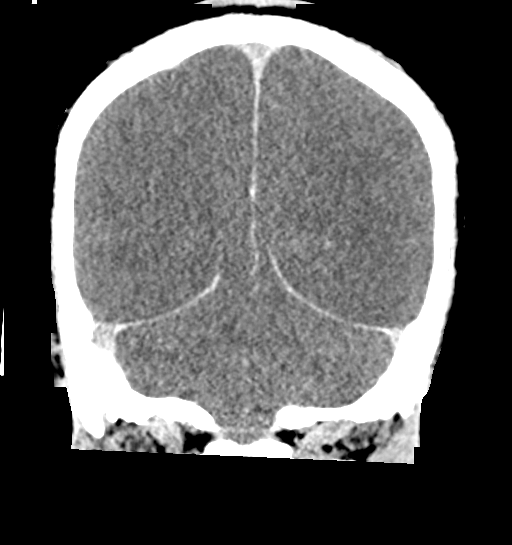
[im 50/90  brain]
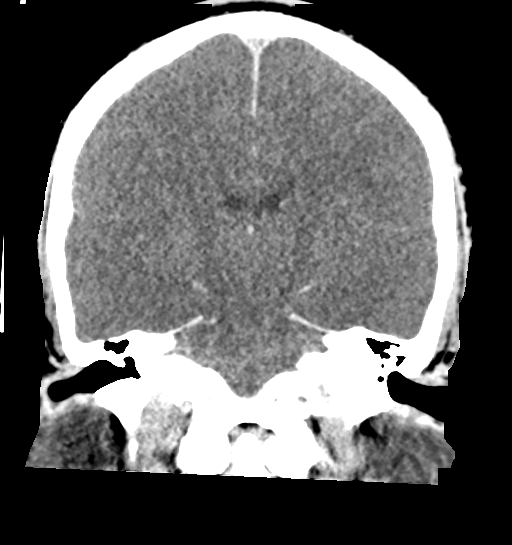

[Series 6: sag soft · sagittal · 0.39mm/px · 3 of 61 slices shown]
[im 21/61  brain]
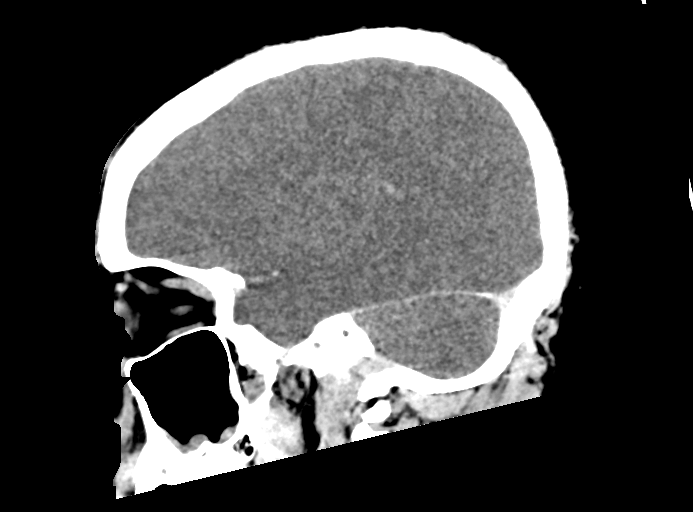
[im 31/61  brain]
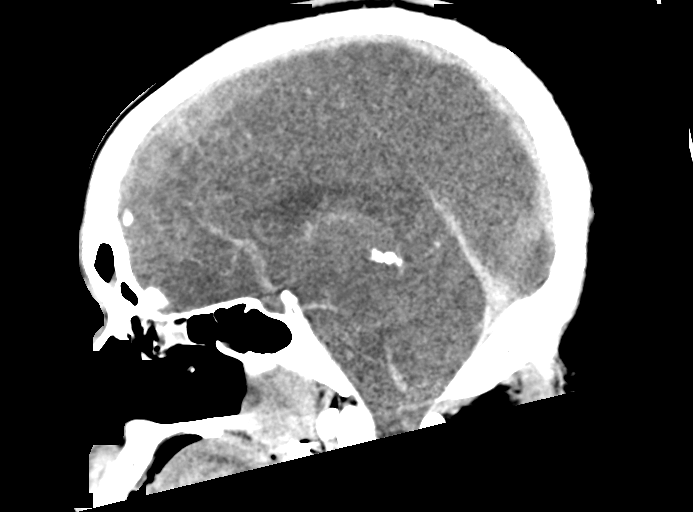
[im 41/61  brain]
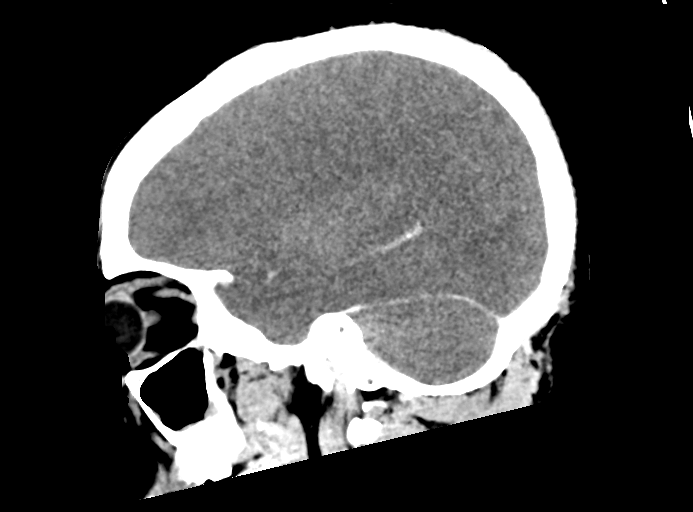

[16 of 47 positions shown; findings below may reference images not displayed]

FINDINGS: Brain: Sulci are effaced. Ventricles are near completely effaced.
The basal cisterns are effaced. Cerebellar tonsils extend at least
11 mm below the foramen magnum. Pseudo subarachnoid hemorrhage is
present with apposition of cortex and diffuse edema resulting in
areas of focal high density. No focal hemorrhage or mass lesion is
present. No focal cortical infarct is evident.

Vascular: No hyperdense vessel or unexpected calcification.

Skull: Calvarium is intact. No focal lytic or blastic lesions are
present. No significant extracranial soft tissue lesion is present.

Sinuses/Orbits: Scattered mucosal thickening is present throughout
ethmoid air cells. Polyp or mucous retention cyst is present in the
inferior left frontal sinus. The paranasal sinuses and mastoid air
cells are otherwise clear. The globes and orbits are within normal
limits.
IMPRESSION: 1. Pseudo subarachnoid hemorrhage consistent with diffuse cerebral
edema and increased intracranial pressure.
2. Diffuse cerebral edema with effacement of the basal cisterns and
ventricles. Findings are consistent with global hypoxic injury.
3. Downward herniation of the brainstem. Cerebellar tonsils extend
at least 11 mm below the foramen magnum.

Critical Value/emergent results were called by telephone at the time
of interpretation on 08/19/2020 at [DATE] to provider Dr. Dondre, who
verbally acknowledged these results.
